# Patient Record
Sex: Male | Born: 1965 | Race: Black or African American | Hispanic: No | Marital: Single | Smoking: Current every day smoker
Health system: Southern US, Community
[De-identification: ages and names within clinical notes are randomized; demographics above are authoritative.]

## PROBLEM LIST (undated history)

## (undated) DIAGNOSIS — S43006A Unspecified dislocation of unspecified shoulder joint, initial encounter: Secondary | ICD-10-CM

## (undated) HISTORY — PX: HERNIA REPAIR: SHX51

---

## 1998-11-07 ENCOUNTER — Emergency Department (HOSPITAL_COMMUNITY): Admission: EM | Admit: 1998-11-07 | Discharge: 1998-11-07 | Payer: Self-pay | Admitting: Emergency Medicine

## 1998-11-07 ENCOUNTER — Encounter: Payer: Self-pay | Admitting: Emergency Medicine

## 2000-06-11 ENCOUNTER — Emergency Department (HOSPITAL_COMMUNITY): Admission: EM | Admit: 2000-06-11 | Discharge: 2000-06-11 | Payer: Self-pay | Admitting: Emergency Medicine

## 2000-06-11 ENCOUNTER — Encounter: Payer: Self-pay | Admitting: Emergency Medicine

## 2000-12-12 ENCOUNTER — Emergency Department (HOSPITAL_COMMUNITY): Admission: EM | Admit: 2000-12-12 | Discharge: 2000-12-12 | Payer: Self-pay | Admitting: Internal Medicine

## 2004-06-11 ENCOUNTER — Emergency Department (HOSPITAL_COMMUNITY): Admission: EM | Admit: 2004-06-11 | Discharge: 2004-06-11 | Payer: Self-pay | Admitting: Emergency Medicine

## 2012-08-14 ENCOUNTER — Emergency Department (HOSPITAL_COMMUNITY): Payer: Self-pay

## 2012-08-14 ENCOUNTER — Encounter (HOSPITAL_COMMUNITY): Payer: Self-pay | Admitting: *Deleted

## 2012-08-14 ENCOUNTER — Emergency Department (HOSPITAL_COMMUNITY)
Admission: EM | Admit: 2012-08-14 | Discharge: 2012-08-14 | Disposition: A | Discharge status: Home / Self Care | Payer: Self-pay | Attending: Emergency Medicine | Admitting: Emergency Medicine

## 2012-08-14 DIAGNOSIS — K420 Umbilical hernia with obstruction, without gangrene: Secondary | ICD-10-CM | POA: Insufficient documentation

## 2012-08-14 DIAGNOSIS — F17200 Nicotine dependence, unspecified, uncomplicated: Secondary | ICD-10-CM | POA: Insufficient documentation

## 2012-08-14 LAB — CBC WITH DIFFERENTIAL/PLATELET
Basophils Absolute: 0 10*3/uL (ref 0.0–0.1)
Basophils Relative: 1 % (ref 0–1)
Eosinophils Absolute: 0.2 10*3/uL (ref 0.0–0.7)
Eosinophils Relative: 3 % (ref 0–5)
HCT: 45 % (ref 39.0–52.0)
Hemoglobin: 15.6 g/dL (ref 13.0–17.0)
Lymphocytes Relative: 23 % (ref 12–46)
Lymphs Abs: 1.7 10*3/uL (ref 0.7–4.0)
MCH: 31.6 pg (ref 26.0–34.0)
MCHC: 34.7 g/dL (ref 30.0–36.0)
MCV: 91.3 fL (ref 78.0–100.0)
Monocytes Absolute: 0.6 10*3/uL (ref 0.1–1.0)
Monocytes Relative: 8 % (ref 3–12)
Neutro Abs: 4.7 10*3/uL (ref 1.7–7.7)
Neutrophils Relative %: 65 % (ref 43–77)
Platelets: 221 10*3/uL (ref 150–400)
RBC: 4.93 MIL/uL (ref 4.22–5.81)
RDW: 13.7 % (ref 11.5–15.5)
WBC: 7.2 10*3/uL (ref 4.0–10.5)

## 2012-08-14 LAB — BASIC METABOLIC PANEL
BUN: 18 mg/dL (ref 6–23)
CO2: 27 mEq/L (ref 19–32)
GFR calc Af Amer: >90 mL/min (ref >90)
GFR calc non Af Amer: >90 mL/min (ref >90)

## 2012-08-14 MED ORDER — IOHEXOL 300 MG/ML  SOLN
100.0000 mL | Freq: Once | INTRAMUSCULAR | Status: AC | PRN
  Administered 2012-08-14: 100 mL via INTRAVENOUS

## 2012-08-14 MED ORDER — PROMETHAZINE HCL 25 MG PO TABS: 25.0000 mg | ORAL_TABLET | Freq: Four times a day (QID) | ORAL | Status: DC | PRN

## 2012-08-14 MED ORDER — OXYCODONE-ACETAMINOPHEN 5-325 MG PO TABS
2.0000 | ORAL_TABLET | Freq: Once | ORAL | Status: AC
  Administered 2012-08-14: 2 via ORAL
  Filled 2012-08-14: qty 2

## 2012-08-14 MED ORDER — OXYCODONE-ACETAMINOPHEN 5-325 MG PO TABS: 1.0000 | ORAL_TABLET | ORAL | Status: DC | PRN

## 2012-08-14 NOTE — ED Provider Notes (Signed)
Medical screening examination/treatment/procedure(s) were conducted as a shared visit with non-physician practitioner(s) and myself.  I personally evaluated the patient during the encounter  Pt with umbilical hernia.  Blue discoloration.  Unable to reduce.  Will ct.  Doubt that there is bowel incarcerated, no vomiting, nl bm but will evaluate further with the CT.  Celene Kras, MD 08/14/12 2150

## 2012-08-14 NOTE — ED Provider Notes (Signed)
History     CSN: [id number]  Arrival date & time 08/14/12  1505   First MD Initiated Contact with Patient 08/14/12 1750      Chief Complaint  Patient presents with  . Abdominal Pain    (Consider location/radiation/quality/duration/timing/severity/associated sxs/prior treatment) HPI Comments: 46 year old male presents emergency department with sudden onset abdominal pain around his umbilicus after lifting things into the trash on Sunday night. He noticed a lump coming out of his belly button at that time. The lump is painful, worse with movements such as laying on his side causing a sharp pain rated 8/10. He has not tried any alleviating factors. No history of a hernia. He has had normal bowel movements daily since the pain began. Denies any nausea, vomiting.  Patient is a 46 y.o. male presenting with abdominal pain. The history is provided by the patient.  Abdominal Pain The primary symptoms of the illness include abdominal pain. The primary symptoms of the illness do not include shortness of breath, nausea or vomiting.  Symptoms associated with the illness do not include constipation or back pain.    History reviewed. No pertinent past medical history.  History reviewed. No pertinent past surgical history.  No family history on file.  History  Substance Use Topics  . Smoking status: Current Every Day Smoker  . Smokeless tobacco: Not on file  . Alcohol Use: No      Review of Systems  Constitutional: Negative for activity change.  Respiratory: Negative for shortness of breath.   Cardiovascular: Negative for chest pain.  Gastrointestinal: Positive for abdominal pain. Negative for nausea, vomiting and constipation.  Genitourinary: Negative for difficulty urinating.  Musculoskeletal: Negative for back pain.  Skin: Negative for color change.  Neurological: Negative for weakness.    Allergies  Review of patient's allergies indicates no known allergies.  Home Medications     Current Outpatient Rx  Name Route Sig Dispense Refill  . IBUPROFEN 200 MG PO TABS Oral Take 800 mg by mouth every 6 (six) hours as needed.      BP 146/82  Pulse 66  Temp 97.9 F (36.6 C) (Oral)  Resp 14  SpO2 96%  Physical Exam  Nursing note and vitals reviewed. Constitutional: He is oriented to person, place, and time. He appears well-developed and well-nourished. No distress.  HENT:  Head: Normocephalic and atraumatic.  Eyes: Conjunctivae normal are normal.  Neck: Normal range of motion. Neck supple.  Cardiovascular: Normal rate, regular rhythm and normal heart sounds.   Pulmonary/Chest: Effort normal and breath sounds normal.  Abdominal: Soft. Bowel sounds are normal. There is tenderness in the periumbilical area.       1 in diameter non-reducible hernia present in umbilicus. Bowel sounds present. Very tender to palpation.  Musculoskeletal: Normal range of motion.  Neurological: He is alert and oriented to person, place, and time.  Skin: Skin is warm and dry.  Psychiatric: He has a normal mood and affect. His behavior is normal.    ED Course  Procedures (including critical care time)  Labs Reviewed - No data to display No results found.   No diagnosis found.    MDM  46 y/o male with non-reducible umbilical hernia. Will obtain CT scan to r/o strangulation.  8:06 PM Case discussed with Schinlever, PA-C who will take over care of patient at this time.      Trevor Mace, PA-C 08/14/12 2006

## 2012-08-14 NOTE — ED Notes (Signed)
Pt reports lifting things into a trash can Sunday night and had pain at umbilicus. Has what appears to be a hernia protruding from umbilicus. Pain 8/10 with movement and certain positions. Denies urinary symptoms. Denies Hx hernia.

## 2012-08-14 NOTE — ED Provider Notes (Signed)
Pt received from [place], [place].  Pt had a CT of abdomen to further evaluate painful umbilical hernia.  Hernia contains fat and appears to be incarcerated.  Results discussed w/ pt.  On re-examination, umbilical hernia is firm and tender and can not be reduced; abd otherwise soft and non-tender.  Consulted Dr. Maisie Fus w/ GS, she has reviewed the CT and will see him in the office on Friday.  Pt d/c'd home w/ percocet and phenergan and Return precautions discussed.   Arie Sabina [place], [place] 08/14/12 2145

## 2012-08-14 NOTE — ED Provider Notes (Signed)
Error in the age in the MDM.  Pt is 46 not 19.  Gary Kras, MD 08/14/12 2011

## 2012-08-16 ENCOUNTER — Encounter (INDEPENDENT_AMBULATORY_CARE_PROVIDER_SITE_OTHER): Payer: Self-pay | Admitting: General Surgery

## 2012-08-16 ENCOUNTER — Ambulatory Visit (INDEPENDENT_AMBULATORY_CARE_PROVIDER_SITE_OTHER): Payer: Self-pay | Admitting: General Surgery

## 2012-08-16 VITALS — BP 158/110 | HR 72 | Temp 99.1°F | Resp 18 | Ht 75.0 in | Wt 243.0 lb

## 2012-08-16 DIAGNOSIS — K429 Umbilical hernia without obstruction or gangrene: Secondary | ICD-10-CM

## 2012-08-16 NOTE — Progress Notes (Signed)
Chief Complaint  Patient presents with  . Umbilical Hernia    HISTORY:  Gary Kaiser is a 46 y.o. male who presents to clinic with an umbilical hernia.  He noticed a "pop" while at work on Sun and has had pain at his umbilicus since then.  He went to the ED on Wed, and a CT was performed.  This showed a fat containing hernia at his umbilicus.  His pain is controlled right now.    No past medical history     No past surgical history    Current Outpatient Prescriptions  Medication Sig Dispense Refill  . ibuprofen (ADVIL,MOTRIN) 200 MG tablet Take 800 mg by mouth every 6 (six) hours as needed.      Marland Kitchen oxyCODONE-acetaminophen (PERCOCET/ROXICET) 5-325 MG per tablet Take 1 tablet by mouth every 4 (four) hours as needed for pain.  20 tablet  0  . promethazine (PHENERGAN) 25 MG tablet Take 1 tablet (25 mg total) by mouth every 6 (six) hours as needed for nausea.  20 tablet  0     No Known Allergies    No family history   Noncontributory  History   Social History  . Marital Status: Single    Spouse Name: N/A    Number of Children: N/A  . Years of Education: N/A   Social History Main Topics  . Smoking status: Current Every Day Smoker -- 0.5 packs/day  . Smokeless tobacco: None  . Alcohol Use: Yes  . Drug Use: Yes    Special: Marijuana     every day  . Sexually Active: None   Other Topics Concern  . None   Social History Narrative  . None     REVIEW OF SYSTEMS - PERTINENT POSITIVES ONLY: Review of Systems - General ROS: negative for - chills, fatigue or fever Hematological and Lymphatic ROS: negative for - bleeding problems, blood clots or bruising Respiratory ROS: no cough, shortness of breath, or wheezing Cardiovascular ROS: no chest pain or dyspnea on exertion Gastrointestinal ROS: positive for - abdominal pain negative for - change in stools or nausea/vomiting  EXAM: Filed Vitals:   08/16/12 1417  BP: 158/110  Pulse:   Temp:   Resp:     General appearance:  alert, cooperative and no distress Resp: clear to auscultation bilaterally Cardio: regular rate and rhythm GI: normal findings: bowel sounds normal and soft, non-tender and abnormal findings:  umbilical hernia and tender to palpation   LABORATORY RESULTS: Available labs are reviewed  Lab Results  Component Value Date   WBC 7.2 08/14/2012   HGB 15.6 08/14/2012   HCT 45.0 08/14/2012   MCV 91.3 08/14/2012   PLT 221 08/14/2012     Chemistry      Component Value Date/Time   NA 138 08/14/2012 1828   K 3.8 08/14/2012 1828   CL 100 08/14/2012 1828   CO2 27 08/14/2012 1828   BUN 18 08/14/2012 1828   CREATININE 0.71 08/14/2012 1828      Component Value Date/Time   CALCIUM 9.5 08/14/2012 1828       RADIOLOGY RESULTS:   Images and reports are reviewed.  CT Scan Abd/Pelvis 08/14/12  IMPRESSION:  1. Fat containing umbilical hernia is present which may be incarcerated.  2. Mild increased caliber of the proximal small bowel loops which may indicate ileus. No evidence for high-grade bowel obstruction.   ASSESSMENT AND PLAN: Hypertension- pt will make an apt to see PCP urgently Umbilical Hernia- we will schedule  for laparoscopic repair as soon as possible.  Continue narcotics as needed for pain.     Vanita Panda, MD Colon and Rectal Surgery / General Surgery [facility]      Visit Diagnoses: 1. Umbilical hernia     Primary Care Physician: No primary provider on file.

## 2012-08-16 NOTE — Patient Instructions (Signed)
Please make an appointment with a primary care doctor ASAP to discuss your high blood pressure.  We have provided you with information on hernia repair.  We will schedule this as soon as possible.

## 2012-08-19 ENCOUNTER — Encounter (HOSPITAL_COMMUNITY): Payer: Self-pay | Admitting: *Deleted

## 2012-08-20 ENCOUNTER — Other Ambulatory Visit (INDEPENDENT_AMBULATORY_CARE_PROVIDER_SITE_OTHER): Payer: Self-pay | Admitting: General Surgery

## 2012-08-23 ENCOUNTER — Ambulatory Visit (HOSPITAL_COMMUNITY)
Admission: RE | Admit: 2012-08-23 | Discharge: 2012-08-23 | Disposition: A | Discharge status: Home / Self Care | Payer: Self-pay | Source: Ambulatory Visit | Attending: General Surgery | Admitting: General Surgery

## 2012-08-23 ENCOUNTER — Encounter (HOSPITAL_COMMUNITY)
Admission: RE | Disposition: A | Discharge status: Home / Self Care | Payer: Self-pay | Source: Ambulatory Visit | Attending: General Surgery

## 2012-08-23 ENCOUNTER — Ambulatory Visit (HOSPITAL_COMMUNITY): Payer: Self-pay | Admitting: Anesthesiology

## 2012-08-23 ENCOUNTER — Encounter (HOSPITAL_COMMUNITY): Payer: Self-pay | Admitting: Anesthesiology

## 2012-08-23 ENCOUNTER — Encounter (HOSPITAL_COMMUNITY): Payer: Self-pay | Admitting: *Deleted

## 2012-08-23 DIAGNOSIS — I10 Essential (primary) hypertension: Secondary | ICD-10-CM | POA: Insufficient documentation

## 2012-08-23 DIAGNOSIS — K429 Umbilical hernia without obstruction or gangrene: Secondary | ICD-10-CM

## 2012-08-23 DIAGNOSIS — F17200 Nicotine dependence, unspecified, uncomplicated: Secondary | ICD-10-CM | POA: Insufficient documentation

## 2012-08-23 HISTORY — PX: UMBILICAL HERNIA REPAIR: SHX196

## 2012-08-23 LAB — SURGICAL PCR SCREEN
MRSA, PCR: NEGATIVE
Staphylococcus aureus: POSITIVE — AB

## 2012-08-23 SURGERY — INSERTION OF MESH
Anesthesia: General | Site: Abdomen | Wound class: Clean

## 2012-08-23 MED ORDER — SODIUM CHLORIDE 0.9 % IJ SOLN: 3.0000 mL | INTRAMUSCULAR | Status: DC | PRN

## 2012-08-23 MED ORDER — ONDANSETRON HCL 4 MG/2ML IJ SOLN
INTRAMUSCULAR | Status: DC | PRN
  Administered 2012-08-23: 4 mg via INTRAVENOUS

## 2012-08-23 MED ORDER — MUPIROCIN 2 % EX OINT: TOPICAL_OINTMENT | Freq: Two times a day (BID) | CUTANEOUS | Status: DC

## 2012-08-23 MED ORDER — SODIUM CHLORIDE 0.9 % IV SOLN: 250.0000 mL | INTRAVENOUS | Status: DC | PRN

## 2012-08-23 MED ORDER — MORPHINE SULFATE 10 MG/ML IJ SOLN: 2.0000 mg | INTRAMUSCULAR | Status: DC | PRN

## 2012-08-23 MED ORDER — PROPOFOL 10 MG/ML IV BOLUS
INTRAVENOUS | Status: DC | PRN
  Administered 2012-08-23: 200 mg via INTRAVENOUS

## 2012-08-23 MED ORDER — LACTATED RINGERS IV SOLN
INTRAVENOUS | Status: DC | PRN
  Administered 2012-08-23 (×2): via INTRAVENOUS

## 2012-08-23 MED ORDER — CHLORHEXIDINE GLUCONATE 4 % EX LIQD: 1.0000 "application " | Freq: Once | CUTANEOUS | Status: DC

## 2012-08-23 MED ORDER — ACETAMINOPHEN 10 MG/ML IV SOLN
INTRAVENOUS | Status: DC | PRN
  Administered 2012-08-23: 1000 mg via INTRAVENOUS

## 2012-08-23 MED ORDER — ONDANSETRON HCL 4 MG/2ML IJ SOLN: 4.0000 mg | Freq: Four times a day (QID) | INTRAMUSCULAR | Status: DC | PRN

## 2012-08-23 MED ORDER — NEOSTIGMINE METHYLSULFATE 1 MG/ML IJ SOLN
INTRAMUSCULAR | Status: DC | PRN
  Administered 2012-08-23: 4 mg via INTRAVENOUS

## 2012-08-23 MED ORDER — CEFAZOLIN SODIUM-DEXTROSE 2-3 GM-% IV SOLR
2.0000 g | INTRAVENOUS | Status: AC
  Administered 2012-08-23: 2 g via INTRAVENOUS

## 2012-08-23 MED ORDER — MIDAZOLAM HCL 5 MG/5ML IJ SOLN
INTRAMUSCULAR | Status: DC | PRN
  Administered 2012-08-23: 2 mg via INTRAVENOUS

## 2012-08-23 MED ORDER — HYDROMORPHONE HCL PF 1 MG/ML IJ SOLN
INTRAMUSCULAR | Status: AC
  Filled 2012-08-23: qty 1

## 2012-08-23 MED ORDER — HYDROMORPHONE HCL PF 1 MG/ML IJ SOLN
0.2500 mg | INTRAMUSCULAR | Status: DC | PRN
  Administered 2012-08-23 (×3): 0.5 mg via INTRAVENOUS

## 2012-08-23 MED ORDER — GLYCOPYRROLATE 0.2 MG/ML IJ SOLN
INTRAMUSCULAR | Status: DC | PRN
  Administered 2012-08-23: 0.6 mg via INTRAVENOUS

## 2012-08-23 MED ORDER — SUFENTANIL CITRATE 50 MCG/ML IV SOLN
INTRAVENOUS | Status: DC | PRN
  Administered 2012-08-23 (×5): 10 ug via INTRAVENOUS

## 2012-08-23 MED ORDER — LIDOCAINE HCL (CARDIAC) 20 MG/ML IV SOLN
INTRAVENOUS | Status: DC | PRN
  Administered 2012-08-23: 75 mg via INTRAVENOUS

## 2012-08-23 MED ORDER — LACTATED RINGERS IV SOLN: INTRAVENOUS | Status: DC

## 2012-08-23 MED ORDER — ACETAMINOPHEN 325 MG PO TABS: 650.0000 mg | ORAL_TABLET | ORAL | Status: DC | PRN

## 2012-08-23 MED ORDER — HEPARIN SODIUM (PORCINE) 5000 UNIT/ML IJ SOLN
5000.0000 [IU] | Freq: Once | INTRAMUSCULAR | Status: AC
  Administered 2012-08-23: 5000 [IU] via SUBCUTANEOUS
  Filled 2012-08-23: qty 1

## 2012-08-23 MED ORDER — OXYCODONE HCL 5 MG PO TABS
5.0000 mg | ORAL_TABLET | ORAL | Status: DC | PRN
  Administered 2012-08-23: 5 mg via ORAL
  Filled 2012-08-23: qty 1

## 2012-08-23 MED ORDER — MUPIROCIN 2 % EX OINT
TOPICAL_OINTMENT | CUTANEOUS | Status: AC
  Administered 2012-08-23: 1 via NASAL
  Filled 2012-08-23: qty 22

## 2012-08-23 MED ORDER — SODIUM CHLORIDE 0.9 % IJ SOLN: 3.0000 mL | Freq: Two times a day (BID) | INTRAMUSCULAR | Status: DC

## 2012-08-23 MED ORDER — OXYCODONE-ACETAMINOPHEN 5-325 MG PO TABS: 1.0000 | ORAL_TABLET | ORAL | Status: DC | PRN

## 2012-08-23 MED ORDER — BUPIVACAINE-EPINEPHRINE 0.25% -1:200000 IJ SOLN
INTRAMUSCULAR | Status: DC | PRN
  Administered 2012-08-23: 20 mL

## 2012-08-23 MED ORDER — HYDROMORPHONE HCL PF 1 MG/ML IJ SOLN
INTRAMUSCULAR | Status: AC
  Administered 2012-08-23: 0.5 mg
  Filled 2012-08-23: qty 1

## 2012-08-23 MED ORDER — BUPIVACAINE-EPINEPHRINE PF 0.25-1:200000 % IJ SOLN
INTRAMUSCULAR | Status: AC
  Filled 2012-08-23: qty 30

## 2012-08-23 MED ORDER — ACETAMINOPHEN 650 MG RE SUPP
650.0000 mg | RECTAL | Status: DC | PRN
  Filled 2012-08-23: qty 1

## 2012-08-23 MED ORDER — CEFAZOLIN SODIUM-DEXTROSE 2-3 GM-% IV SOLR
INTRAVENOUS | Status: AC
  Filled 2012-08-23: qty 50

## 2012-08-23 MED ORDER — ROCURONIUM BROMIDE 100 MG/10ML IV SOLN
INTRAVENOUS | Status: DC | PRN
  Administered 2012-08-23: 60 mg via INTRAVENOUS
  Administered 2012-08-23: 10 mg via INTRAVENOUS

## 2012-08-23 MED ORDER — ACETAMINOPHEN 10 MG/ML IV SOLN
INTRAVENOUS | Status: AC
  Filled 2012-08-23: qty 100

## 2012-08-23 SURGICAL SUPPLY — 51 items
APPLIER CLIP LOGIC TI 5 (MISCELLANEOUS) IMPLANT
BENZOIN TINCTURE PRP APPL 2/3 (GAUZE/BANDAGES/DRESSINGS) IMPLANT
CABLE HIGH FREQUENCY MONO STRZ (ELECTRODE) ×3 IMPLANT
CANISTER SUCTION 2500CC (MISCELLANEOUS) ×3 IMPLANT
CHLORAPREP W/TINT 26ML (MISCELLANEOUS) ×3 IMPLANT
CLOTH BEACON ORANGE TIMEOUT ST (SAFETY) ×3 IMPLANT
DECANTER SPIKE VIAL GLASS SM (MISCELLANEOUS) ×3 IMPLANT
DERMABOND ADVANCED (GAUZE/BANDAGES/DRESSINGS) ×1
DERMABOND ADVANCED .7 DNX12 (GAUZE/BANDAGES/DRESSINGS) ×2 IMPLANT
DEVICE SECURE STRAP 25 ABSORB (INSTRUMENTS) ×3 IMPLANT
DEVICE TROCAR PUNCTURE CLOSURE (ENDOMECHANICALS) ×3 IMPLANT
DRAPE INCISE IOBAN 66X45 STRL (DRAPES) ×3 IMPLANT
DRAPE LAPAROSCOPIC ABDOMINAL (DRAPES) ×3 IMPLANT
DRAPE UTILITY XL STRL (DRAPES) ×3 IMPLANT
DRSG TEGADERM 2-3/8X2-3/4 SM (GAUZE/BANDAGES/DRESSINGS) ×3 IMPLANT
ELECT REM PT RETURN 9FT ADLT (ELECTROSURGICAL) ×3
ELECTRODE REM PT RTRN 9FT ADLT (ELECTROSURGICAL) ×2 IMPLANT
GLOVE BIO SURGEON STRL SZ7.5 (GLOVE) ×3 IMPLANT
GLOVE BIOGEL M STRL SZ7.5 (GLOVE) IMPLANT
GLOVE BIOGEL PI IND STRL 7.0 (GLOVE) ×2 IMPLANT
GLOVE BIOGEL PI INDICATOR 7.0 (GLOVE) ×1
GLOVE INDICATOR 8.0 STRL GRN (GLOVE) ×3 IMPLANT
GOWN STRL NON-REIN LRG LVL3 (GOWN DISPOSABLE) ×3 IMPLANT
GOWN STRL REIN XL XLG (GOWN DISPOSABLE) ×6 IMPLANT
KIT BASIN OR (CUSTOM PROCEDURE TRAY) ×3 IMPLANT
MESH PARIETEX 4.7 (Mesh General) ×3 IMPLANT
NEEDLE HYPO 22GX1.5 SAFETY (NEEDLE) ×6 IMPLANT
NEEDLE INSUFFLATION 14GA 120MM (NEEDLE) IMPLANT
NEEDLE SPNL 22GX3.5 QUINCKE BK (NEEDLE) IMPLANT
NS IRRIG 1000ML POUR BTL (IV SOLUTION) ×3 IMPLANT
PEN SKIN MARKING BROAD (MISCELLANEOUS) ×3 IMPLANT
SCALPEL HARMONIC ACE (MISCELLANEOUS) IMPLANT
SCISSORS LAP 5X35 DISP (ENDOMECHANICALS) IMPLANT
SOLUTION ANTI FOG 6CC (MISCELLANEOUS) ×3 IMPLANT
STAPLER VISISTAT 35W (STAPLE) IMPLANT
STRIP CLOSURE SKIN 1/2X4 (GAUZE/BANDAGES/DRESSINGS) IMPLANT
SUT MNCRL AB 4-0 PS2 18 (SUTURE) ×6 IMPLANT
SUT NOVA NAB GS-21 0 18 T12 DT (SUTURE) IMPLANT
SUT PROLENE 0 CT 2 (SUTURE) ×6 IMPLANT
SUT PROLENE 1 CT 1 30 (SUTURE) IMPLANT
SUT PROLENE 2 0 CT 1 CR (SUTURE) ×3 IMPLANT
SUT PROLENE 2 0 SH DA (SUTURE) IMPLANT
TACKER 5MM HERNIA 3.5CML NAB (ENDOMECHANICALS) IMPLANT
TOWEL OR 17X26 10 PK STRL BLUE (TOWEL DISPOSABLE) ×6 IMPLANT
TRAY FOLEY CATH 14FRSI W/METER (CATHETERS) IMPLANT
TRAY LAP CHOLE (CUSTOM PROCEDURE TRAY) ×3 IMPLANT
TROCAR BLADELESS OPT 5 100 (ENDOMECHANICALS) ×3 IMPLANT
TROCAR BLADELESS OPT 5 75 (ENDOMECHANICALS) ×3 IMPLANT
TROCAR XCEL BLUNT TIP 100MML (ENDOMECHANICALS) IMPLANT
TROCAR XCEL NON-BLD 11X100MML (ENDOMECHANICALS) ×3 IMPLANT
TUBING INSUFFLATION 10FT LAP (TUBING) ×3 IMPLANT

## 2012-08-23 NOTE — Anesthesia Postprocedure Evaluation (Signed)
  Anesthesia Post-op Note  Patient: Gary Kaiser  Procedure(s) Performed: Procedure(s) (LRB): INSERTION OF MESH (N/A) LAPAROSCOPIC UMBILICAL HERNIA (N/A)  Patient Location: PACU  Anesthesia Type: General  Level of Consciousness: awake and alert   Airway and Oxygen Therapy: Patient Spontanous Breathing  Post-op Pain: mild  Post-op Assessment: Post-op Vital signs reviewed, Patient's Cardiovascular Status Stable, Respiratory Function Stable, Patent Airway and No signs of Nausea or vomiting  Post-op Vital Signs: stable  Complications: No apparent anesthesia complications

## 2012-08-23 NOTE — H&P (View-Only) (Signed)
Chief Complaint  Patient presents with  . Umbilical Hernia    HISTORY:  Gary Kaiser is a 46 y.o. male who presents to clinic with an umbilical hernia.  He noticed a "pop" while at work on Sun and has had pain at his umbilicus since then.  He went to the ED on Wed, and a CT was performed.  This showed a fat containing hernia at his umbilicus.  His pain is controlled right now.    No past medical history     No past surgical history    Current Outpatient Prescriptions  Medication Sig Dispense Refill  . ibuprofen (ADVIL,MOTRIN) 200 MG tablet Take 800 mg by mouth every 6 (six) hours as needed.      . oxyCODONE-acetaminophen (PERCOCET/ROXICET) 5-325 MG per tablet Take 1 tablet by mouth every 4 (four) hours as needed for pain.  20 tablet  0  . promethazine (PHENERGAN) 25 MG tablet Take 1 tablet (25 mg total) by mouth every 6 (six) hours as needed for nausea.  20 tablet  0     No Known Allergies    No family history   Noncontributory  History   Social History  . Marital Status: Single    Spouse Name: N/A    Number of Children: N/A  . Years of Education: N/A   Social History Main Topics  . Smoking status: Current Every Day Smoker -- 0.5 packs/day  . Smokeless tobacco: None  . Alcohol Use: Yes  . Drug Use: Yes    Special: Marijuana     every day  . Sexually Active: None   Other Topics Concern  . None   Social History Narrative  . None     REVIEW OF SYSTEMS - PERTINENT POSITIVES ONLY: Review of Systems - General ROS: negative for - chills, fatigue or fever Hematological and Lymphatic ROS: negative for - bleeding problems, blood clots or bruising Respiratory ROS: no cough, shortness of breath, or wheezing Cardiovascular ROS: no chest pain or dyspnea on exertion Gastrointestinal ROS: positive for - abdominal pain negative for - change in stools or nausea/vomiting  EXAM: Filed Vitals:   08/16/12 1417  BP: 158/110  Pulse:   Temp:   Resp:     General appearance:  alert, cooperative and no distress Resp: clear to auscultation bilaterally Cardio: regular rate and rhythm GI: normal findings: bowel sounds normal and soft, non-tender and abnormal findings:  umbilical hernia and tender to palpation   LABORATORY RESULTS: Available labs are reviewed  Lab Results  Component Value Date   WBC 7.2 08/14/2012   HGB 15.6 08/14/2012   HCT 45.0 08/14/2012   MCV 91.3 08/14/2012   PLT 221 08/14/2012     Chemistry      Component Value Date/Time   NA 138 08/14/2012 1828   K 3.8 08/14/2012 1828   CL 100 08/14/2012 1828   CO2 27 08/14/2012 1828   BUN 18 08/14/2012 1828   CREATININE 0.71 08/14/2012 1828      Component Value Date/Time   CALCIUM 9.5 08/14/2012 1828       RADIOLOGY RESULTS:   Images and reports are reviewed.  CT Scan Abd/Pelvis 08/14/12  IMPRESSION:  1. Fat containing umbilical hernia is present which may be incarcerated.  2. Mild increased caliber of the proximal small bowel loops which may indicate ileus. No evidence for high-grade bowel obstruction.   ASSESSMENT AND PLAN: Hypertension- pt will make an apt to see PCP urgently Umbilical Hernia- we will schedule   for laparoscopic repair as soon as possible.  Continue narcotics as needed for pain.     Phillis Thackeray C Nilesh Stegall, MD Colon and Rectal Surgery / General Surgery [facility]      Visit Diagnoses: 1. Umbilical hernia     Primary Care Physician: No primary provider on file.     

## 2012-08-23 NOTE — Interval H&P Note (Signed)
History and Physical Interval Note:  08/23/2012 7:02 AM  Thurmond Butts  has presented today for surgery, with the diagnosis of ventral hernia  The various methods of treatment have been discussed with the patient and family. After consideration of risks, benefits and other options for treatment, the patient has consented to  Procedure(s) (LRB) with comments: LAPAROSCOPIC VENTRAL HERNIA (N/A) - Laparoscopic Ventral Hernia Repair with Mesh INSERTION OF MESH (N/A) as a surgical intervention .  The patient's history has been reviewed, patient examined, no change in status, stable for surgery.  I have reviewed the patient's chart and labs.  Questions were answered to the patient's satisfaction.  We discussed the risks of the operation including bleeding, infection, postoperative pain, recurrence and need for additional surgery.  I believe he understands these risks and has agreed to proceed.   Vanita Panda, MD  Colorectal and General Surgery [facility]

## 2012-08-23 NOTE — Anesthesia Preprocedure Evaluation (Addendum)
Anesthesia Evaluation  Patient identified by MRN, date of birth, ID band Patient awake    Reviewed: Allergy & Precautions, H&P , NPO status , Patient's Chart, lab work & pertinent test results  Airway Mallampati: II TM Distance: >3 FB Neck ROM: full    Dental No notable dental hx. (+) Teeth Intact and Dental Advisory Given   Pulmonary neg pulmonary ROS, Current Smoker,  breath sounds clear to auscultation  Pulmonary exam normal       Cardiovascular Exercise Tolerance: Good negative cardio ROS  Rhythm:regular Rate:Normal     Neuro/Psych negative neurological ROS  negative psych ROS   GI/Hepatic negative GI ROS, Neg liver ROS,   Endo/Other  negative endocrine ROS  Renal/GU negative Renal ROS  negative genitourinary   Musculoskeletal   Abdominal   Peds  Hematology negative hematology ROS (+)   Anesthesia Other Findings   Reproductive/Obstetrics negative OB ROS                         Anesthesia Physical Anesthesia Plan  ASA: II  Anesthesia Plan: General   Post-op Pain Management:    Induction: Intravenous  Airway Management Planned: Oral ETT  Additional Equipment:   Intra-op Plan:   Post-operative Plan: Extubation in OR  Informed Consent: I have reviewed the patients History and Physical, chart, labs and discussed the procedure including the risks, benefits and alternatives for the proposed anesthesia with the patient or authorized representative who has indicated his/her understanding and acceptance.   Dental Advisory Given  Plan Discussed with: CRNA and Surgeon  Anesthesia Plan Comments:       Anesthesia Quick Evaluation

## 2012-08-23 NOTE — Op Note (Signed)
08/23/2012  8:43 AM  PATIENT:  Gary Kaiser  46 y.o. male  PRE-OPERATIVE DIAGNOSIS:  umbilical hernia  POST-OPERATIVE DIAGNOSIS:  umbilical hernia  PROCEDURE:  Procedure(s) (LRB) with comments: INSERTION OF MESH (N/A) LAPAROSCOPIC UMBILICAL HERNIA (N/A)  SURGEON:  Surgeon(s) and Role:    * Romie Levee, MD - Primary    * Robyne Askew, MD - Assisting   ANESTHESIA:   general  EBL:  Total I/O In: 1000 [I.V.:1000] Out: -   BLOOD ADMINISTERED:none  DRAINS: none   LOCAL MEDICATIONS USED:  MARCAINE     SPECIMEN:  No Specimen  COUNTS:  YES  PLAN OF CARE: Discharge to home after PACU  PATIENT DISPOSITION:  PACU - hemodynamically stable.   Delay start of Pharmacological VTE agent (>24hrs) due to surgical blood loss or risk of bleeding: not applicable  INDICATIONS:  Gary Kaiser is a 46 y.o. male who presented to clinic last week with an umbilical hernia. He noticed a "pop" while at work last Sun and has had pain at his umbilicus since then. He went to the ED last Wed, and a CT was performed. This showed a fat containing hernia at his umbilicus.  He is here for repair.  Findings: umbilical hernia, acute (no hernia sac noted)  Procedure: The patient was identified in the preoperative holding area and taken to the operating room where he was laid supine on the operating room table.  General anesthesia was smoothly induced. Patient's abdomen was prepped and draped in the usual sterile fashion.  An ioban barrier was used under the drape. A surgical timeout was performed indicating the correct patient procedure positioning and preoperative antibiotics. SCDs were also noted to be in place prior to the initiation of anesthesia. An incision was made in the left upper quadrant. A optical intrigue 5mm trocar and 0 scope were used to enter the abdomen bluntly. Once inside the abdomen the abdomen was insufflated to approximately 15 mm of mercury. The abdomen was inspected. There were no  injuries from our optical intrigue site. A second 11 mm port was placed in the left lower quadrant under direct visualization. A 5 mm port was placed in the right lower quadrant also under direct visualization. After this was completed the abdomen was inspected. There was an umbilical hernia with an incarcerated portion of omentum. This was removed from the hernia. There was no signs of necrotic tissue. There was a small amount of fluid in the umbilical hernia but the fluid was clear in nature. There are no signs of infection. Fat was cleared around the hernia site using a Bovie electrocautery. Once this was completed the abdomen was desufflated and a 12 mm round prior to ask composite mesh was brought onto the field. The mesh was approximated on top of the abdomen and the abdomen was marked for mesh placement. Four 0 Prolene sutures were then placed through the mesh and sutured into place.  The mesh was then irrigated and rolled up and then placed into the abdomen through the 11 mm port. The mesh was then oriented and a suture passer was used to first closed the hernia site using a 0 Prolene suture. Once this was completed the suture passer was used to bring up the Prolene sutures through the abdominal fascia. These were then tied into place and the mesh was secured.  A tacking device was then used to secure to the abdominal wall. After this was completed the mesh appeared to lie nicely  on the fascia. The remaining abdomen was inspected for hemostasis. There was no sign of active bleeding. The 11 mm port was removed and the fascia was closed using a 0 Prolene suture. This was done laparoscopically. The remaining 5 mm port was removed under direct visualization and there was no active bleeding. The abdomen was then desufflated. The skin of the 3 trocar sites was closed using 4-0 Monocryl suture. Dermabond was also used to close the skin. The patient was then awakened from anesthesia and sent to the postanesthesia  care unit in stable condition. All counts were correct per operating room staff.  I was present and personally performed the entire procedure.

## 2012-08-23 NOTE — Transfer of Care (Signed)
Immediate Anesthesia Transfer of Care Note  Patient: Gary Kaiser  Procedure(s) Performed: Procedure(s) (LRB) with comments: INSERTION OF MESH (N/A) LAPAROSCOPIC UMBILICAL HERNIA (N/A)  Patient Location: PACU  Anesthesia Type: General  Level of Consciousness: awake, sedated and patient cooperative  Airway & Oxygen Therapy: Patient Spontanous Breathing and Patient connected to face mask oxygen  Post-op Assessment: Report given to PACU RN and Post -op Vital signs reviewed and stable  Post vital signs: Reviewed and stable  Complications: No apparent anesthesia complications

## 2012-08-26 ENCOUNTER — Encounter (HOSPITAL_COMMUNITY): Payer: Self-pay | Admitting: General Surgery

## 2012-08-28 ENCOUNTER — Encounter (INDEPENDENT_AMBULATORY_CARE_PROVIDER_SITE_OTHER): Payer: Self-pay | Admitting: General Surgery

## 2012-08-28 ENCOUNTER — Telehealth (INDEPENDENT_AMBULATORY_CARE_PROVIDER_SITE_OTHER): Payer: Self-pay | Admitting: General Surgery

## 2012-08-28 DIAGNOSIS — G8918 Other acute postprocedural pain: Secondary | ICD-10-CM

## 2012-08-28 MED ORDER — OXYCODONE-ACETAMINOPHEN 5-325 MG PO TABS: 1.0000 | ORAL_TABLET | Freq: Four times a day (QID) | ORAL | Status: DC | PRN

## 2012-08-28 NOTE — Telephone Encounter (Signed)
Patient requesting a refill of oxycodone status post hernia repair on 08/23/12. Dr Maisie Fus paged. Okay per Dr

## 2012-08-28 NOTE — Telephone Encounter (Signed)
Okay per Dr Maisie Fus to prescribe oxycodone 5/325 #30 with no refills. To make patient aware to try to start weaning himself off of it. Signed per Dr Magnus Ivan. At the front for patient pick up. Patient's mother aware. Advised to try ibuprofen between doses to ween off.

## 2012-09-11 ENCOUNTER — Encounter (INDEPENDENT_AMBULATORY_CARE_PROVIDER_SITE_OTHER): Payer: Self-pay | Admitting: General Surgery

## 2012-09-11 ENCOUNTER — Encounter (INDEPENDENT_AMBULATORY_CARE_PROVIDER_SITE_OTHER): Payer: Self-pay

## 2012-09-11 ENCOUNTER — Ambulatory Visit (INDEPENDENT_AMBULATORY_CARE_PROVIDER_SITE_OTHER): Payer: Self-pay | Admitting: General Surgery

## 2012-09-11 VITALS — BP 140/88 | HR 72 | Temp 97.7°F | Resp 12 | Ht 75.0 in | Wt 240.4 lb

## 2012-09-11 DIAGNOSIS — K429 Umbilical hernia without obstruction or gangrene: Secondary | ICD-10-CM

## 2012-09-11 NOTE — Patient Instructions (Signed)
OK to return to work.  No lifting anything greater than 20lbs until 11/14

## 2012-09-11 NOTE — Progress Notes (Signed)
Gary Kaiser is a 46 y.o. male who is status post a lap umb hernia repair with mesh on 9/27.  He is doing well.  His pain is getting better.  He is ready to go back to work.  Objective: Filed Vitals:   09/11/12 1636  BP: 140/88  Pulse: 72  Temp: 97.7 F (36.5 C)  Resp: 12    General appearance: alert and cooperative firm umbilicus, nontender to palpation  Incision: healing well   Assessment: s/p  There is no problem list on file for this patient.     Plan:  Healing well.  Umbilicus with scar.  Ok to go back to work on [employer] duty   .Vanita Panda, MD [facility], [place] [phone]   09/11/2012 4:52 PM

## 2012-09-26 ENCOUNTER — Encounter (HOSPITAL_COMMUNITY): Payer: Self-pay | Admitting: *Deleted

## 2012-09-26 ENCOUNTER — Emergency Department (HOSPITAL_COMMUNITY)
Admission: EM | Admit: 2012-09-26 | Discharge: 2012-09-26 | Disposition: A | Discharge status: Home / Self Care | Payer: No Typology Code available for payment source | Attending: Emergency Medicine | Admitting: Emergency Medicine

## 2012-09-26 DIAGNOSIS — Y92410 Unspecified street and highway as the place of occurrence of the external cause: Secondary | ICD-10-CM | POA: Insufficient documentation

## 2012-09-26 DIAGNOSIS — Y9389 Activity, other specified: Secondary | ICD-10-CM | POA: Insufficient documentation

## 2012-09-26 DIAGNOSIS — M25519 Pain in unspecified shoulder: Secondary | ICD-10-CM | POA: Insufficient documentation

## 2012-09-26 DIAGNOSIS — F17200 Nicotine dependence, unspecified, uncomplicated: Secondary | ICD-10-CM | POA: Insufficient documentation

## 2012-09-26 DIAGNOSIS — M25569 Pain in unspecified knee: Secondary | ICD-10-CM | POA: Insufficient documentation

## 2012-09-26 MED ORDER — DIAZEPAM 5 MG PO TABS: 5.0000 mg | ORAL_TABLET | Freq: Two times a day (BID) | ORAL | Status: DC

## 2012-09-26 MED ORDER — OXYCODONE-ACETAMINOPHEN 5-325 MG PO TABS: 1.0000 | ORAL_TABLET | Freq: Four times a day (QID) | ORAL | Status: DC | PRN
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
## 2012-09-27 NOTE — ED Provider Notes (Signed)
Medical screening examination/treatment/procedure(s) were performed by non-physician practitioner and as supervising physician I was immediately available for consultation/collaboration.  Tyrice Hewitt L Tamelia Michalowski, MD 09/27/12 0953 

## 2013-11-08 IMAGING — CT CT ABD-PELV W/ CM
1 of 3 series · 14 of 32 positions shown, 19 images · IV contrast (OMNIPAQUE 300)
Comparison: None.

CLINICAL DATA: Abdominal pain

CT ABDOMEN AND PELVIS WITH CONTRAST
TECHNIQUE: Multidetector CT imaging of the abdomen and pelvis was
performed following the standard protocol during bolus
administration of intravenous contrast.
Contrast: 100mL OMNIPAQUE IOHEXOL 300 MG/ML  SOLN

[Series 2: abd/pel with · axial · 0.74mm/px · z∈[-486,-60]mm · 14 of 96 slices shown, 19 images]
[im 6/96  soft-tissue]
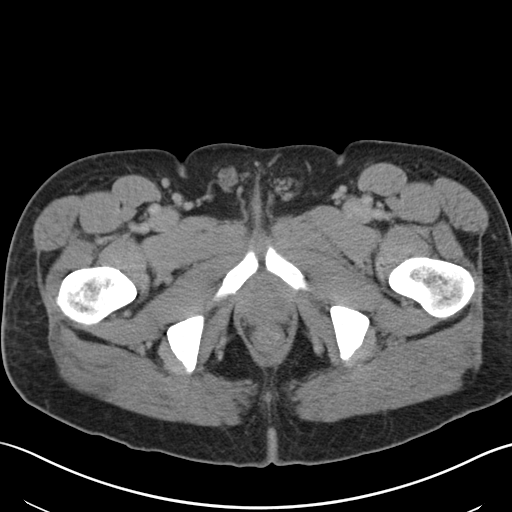
[im 6/96  bone]
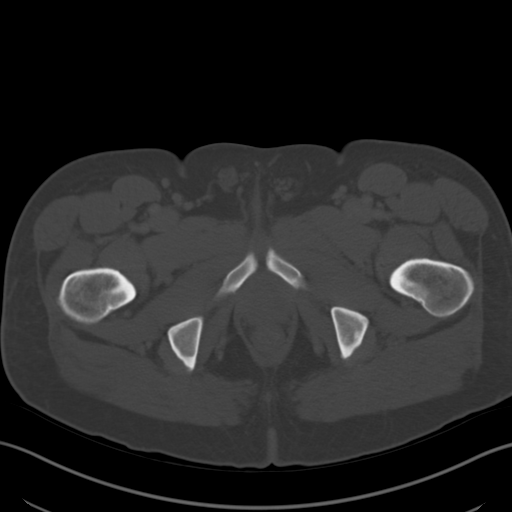
[im 16/96  soft-tissue]
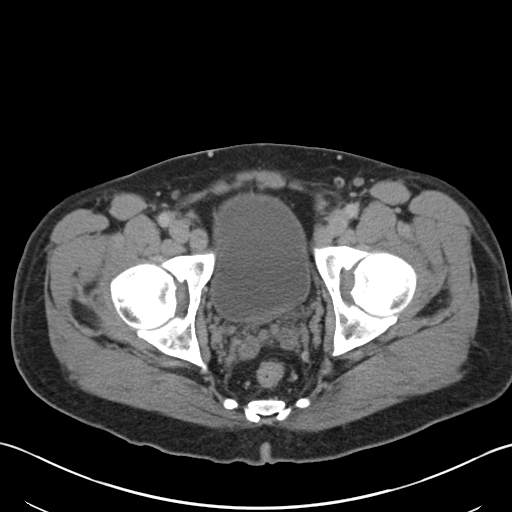
[im 21/96  soft-tissue]
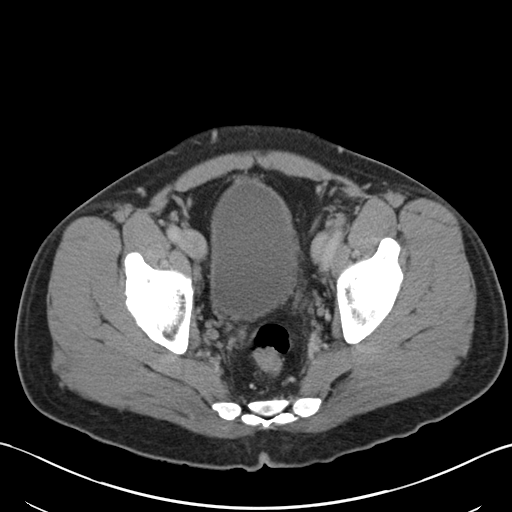
[im 26/96  soft-tissue]
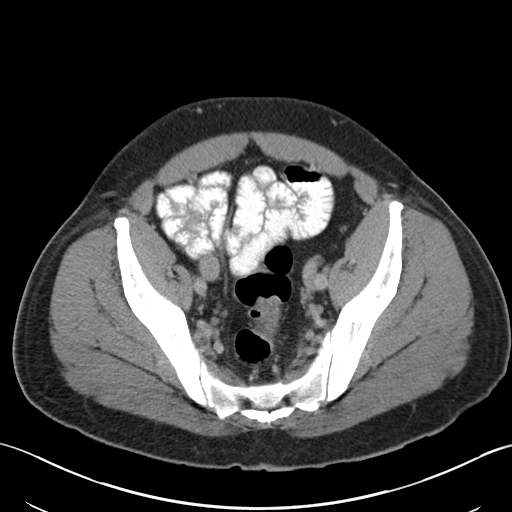
[im 36/96  soft-tissue]
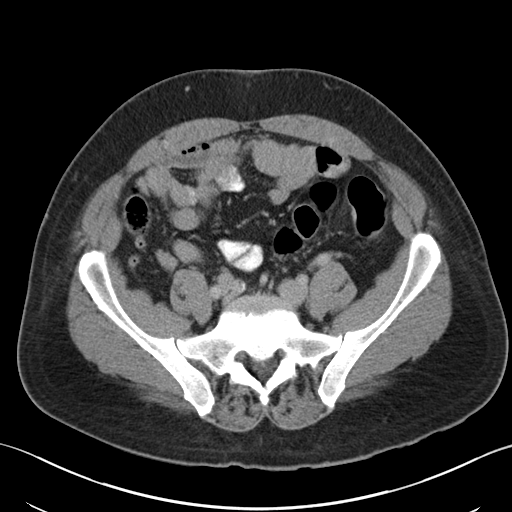
[im 41/96  soft-tissue]
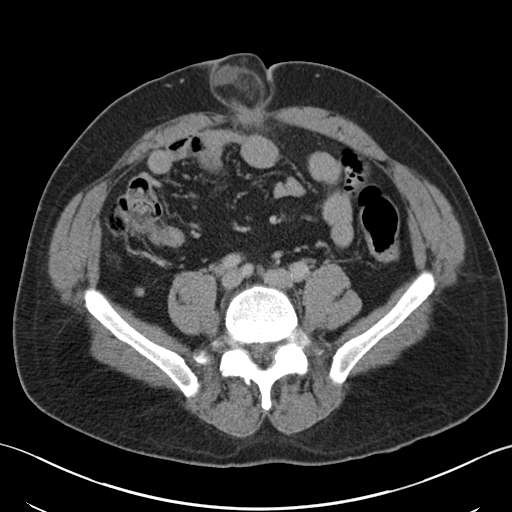
[im 51/96  soft-tissue]
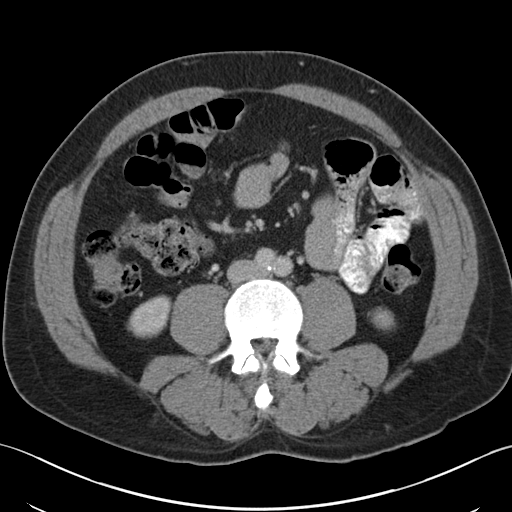
[im 56/96  soft-tissue]
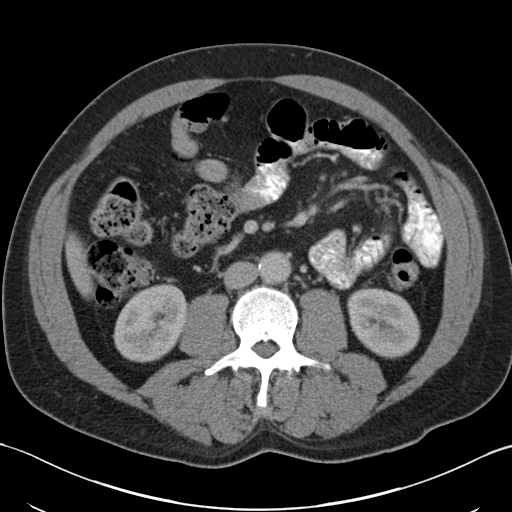
[im 61/96  soft-tissue]
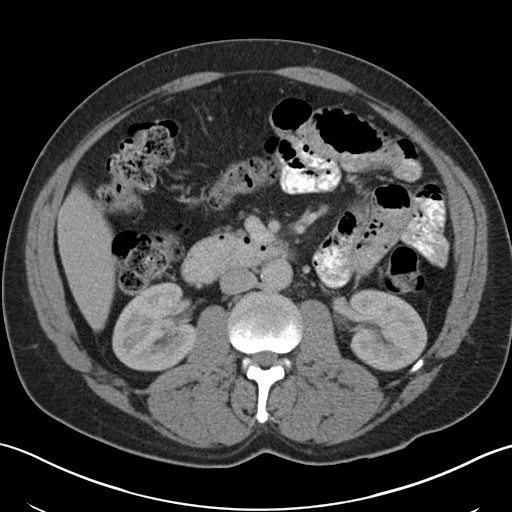
[im 61/96  bone]
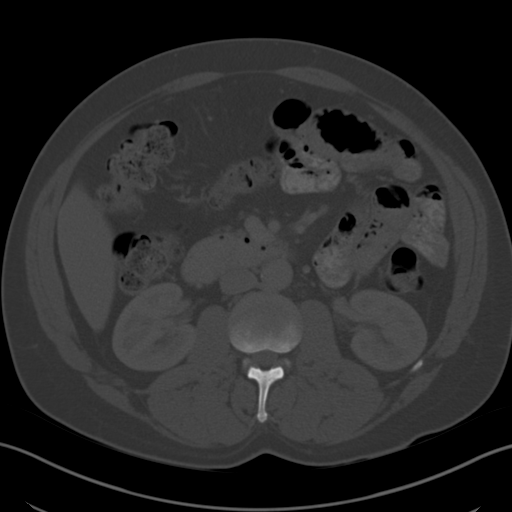
[im 71/96  soft-tissue]
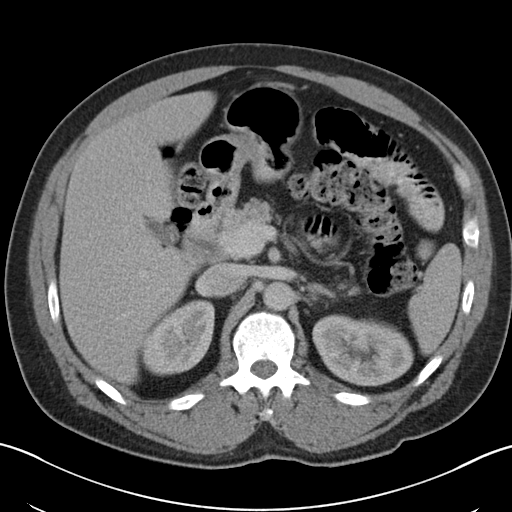
[im 76/96  soft-tissue]
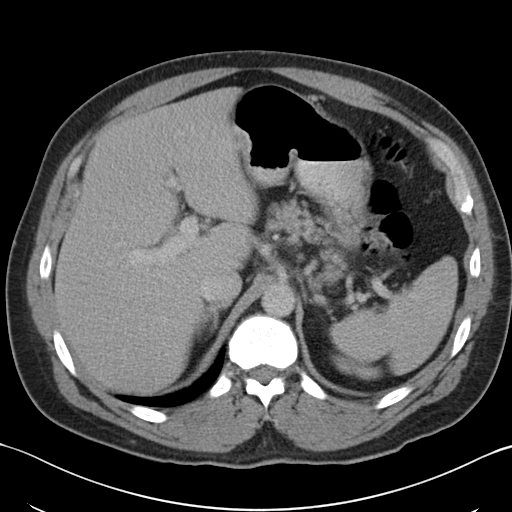
[im 76/96  lung]
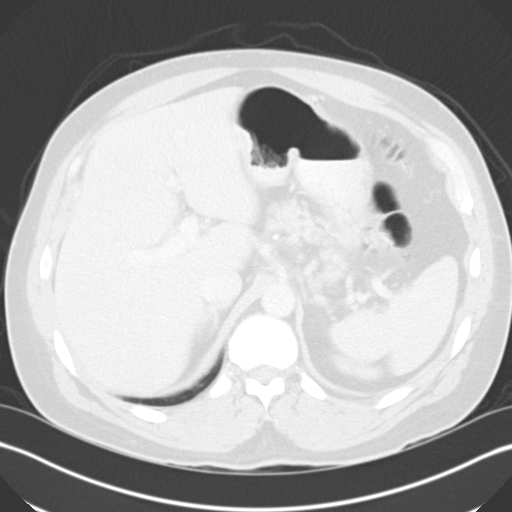
[im 81/96  soft-tissue]
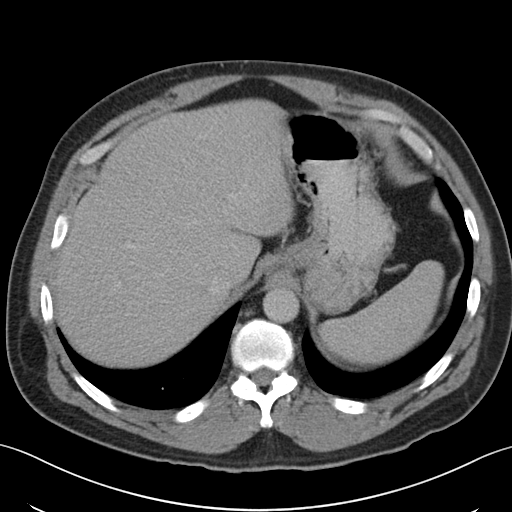
[im 81/96  lung]
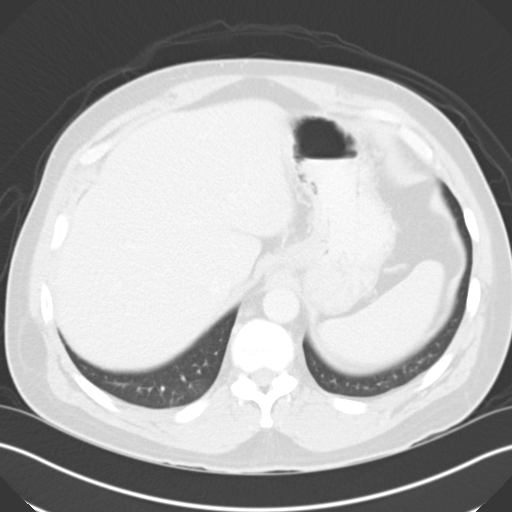
[im 86/96  lung]
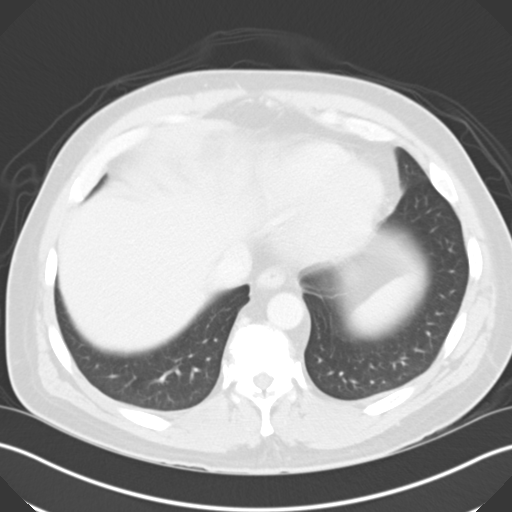
[im 91/96  soft-tissue]
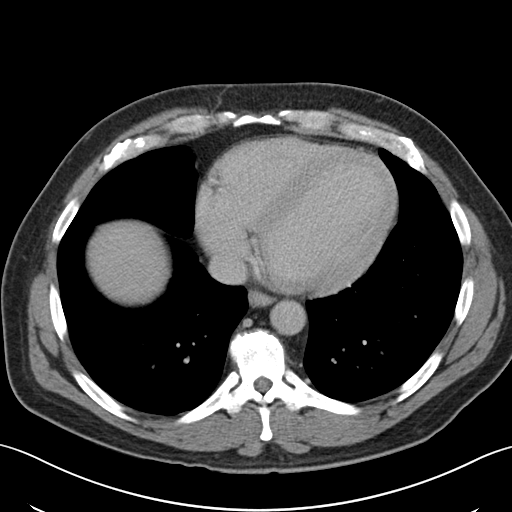
[im 91/96  lung]
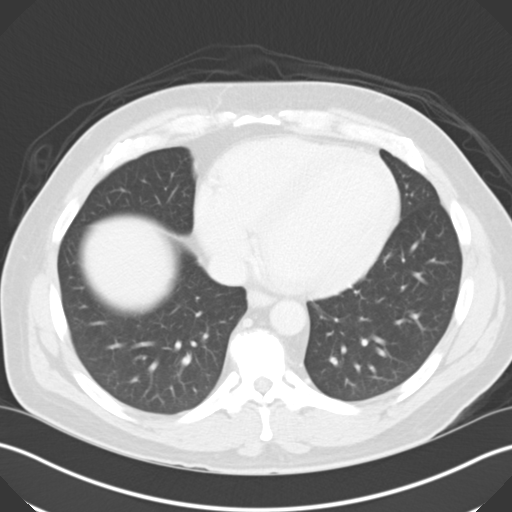

[14 of 32 positions shown; findings below may reference images not displayed]

FINDINGS: The lung bases are clear.  No pericardial or pleural effusion.

There is no focal liver abnormality identified.  The gallbladder
appears within normal limits.  No biliary dilatation.  The pancreas
appears within normal limits.  The spleen is within normal limits.

Both adrenal glands are normal.  The right kidney is normal.  The
left kidney is normal.  The urinary bladder is unremarkable.

No enlarged upper abdominal or pelvic lymph nodes.  No inguinal
adenopathy.

No free fluid or fluid collections identified within the abdomen or
the pelvis.  The stomach appears normal.  The small bowel loops are
upper limits of normal measuring up to 3.2 cm.  Enteric contrast
material is identified within the mid to distal small bowel loops.
The colon appears unremarkable.  There is a periumbilical hernia
which contains fat measuring 4.5 x 4.2 x 4.9 cm.  Fluid and fat
stranding is identified within this hernia which is suspicious for
incarceration.

Review of the visualized bony structures is significant for
advanced facet hypertrophy and degenerative change within the lower
lumbar spine.
IMPRESSION: 1.  Fat containing umbilical hernia is present which may be
incarcerated.
2.  Mild increased caliber of the proximal small bowel loops which
may indicate ileus.  No evidence for high-grade bowel obstruction.

## 2014-03-09 ENCOUNTER — Emergency Department (HOSPITAL_COMMUNITY)
Admission: EM | Admit: 2014-03-09 | Discharge: 2014-03-09 | Disposition: A | Discharge status: Home / Self Care | Payer: No Typology Code available for payment source | Attending: Emergency Medicine | Admitting: Emergency Medicine

## 2014-03-09 ENCOUNTER — Encounter (HOSPITAL_COMMUNITY): Payer: Self-pay | Admitting: Emergency Medicine

## 2014-03-09 DIAGNOSIS — F17200 Nicotine dependence, unspecified, uncomplicated: Secondary | ICD-10-CM | POA: Insufficient documentation

## 2014-03-09 DIAGNOSIS — J3489 Other specified disorders of nose and nasal sinuses: Secondary | ICD-10-CM | POA: Insufficient documentation

## 2014-03-09 DIAGNOSIS — B309 Viral conjunctivitis, unspecified: Secondary | ICD-10-CM

## 2014-03-09 DIAGNOSIS — Z79899 Other long term (current) drug therapy: Secondary | ICD-10-CM | POA: Insufficient documentation

## 2014-03-09 DIAGNOSIS — B308 Other viral conjunctivitis: Secondary | ICD-10-CM | POA: Insufficient documentation

## 2014-03-09 MED ORDER — ERYTHROMYCIN 5 MG/GM OP OINT: TOPICAL_OINTMENT | OPHTHALMIC | Status: AC

## 2014-03-09 MED ORDER — LORATADINE 10 MG PO TABS
10.0000 mg | ORAL_TABLET | Freq: Once | ORAL | Status: AC
  Administered 2014-03-09: 10 mg via ORAL
  Filled 2014-03-09: qty 1

## 2014-03-09 MED ORDER — LORATADINE 10 MG PO TABS: 10.0000 mg | ORAL_TABLET | Freq: Every day | ORAL | Status: AC

## 2014-03-09 MED ORDER — OLOPATADINE HCL 0.1 % OP SOLN
1.0000 [drp] | Freq: Two times a day (BID) | OPHTHALMIC | Status: DC
  Administered 2014-03-09: 1 [drp] via OPHTHALMIC
  Filled 2014-03-09: qty 5

## 2014-03-09 NOTE — ED Provider Notes (Signed)
CSN: [id number]     Arrival date & time 03/09/14  0809 History   First MD Initiated Contact with Patient 03/09/14 0830     Chief Complaint  Patient presents with  . eyes swollen      (Consider location/radiation/quality/duration/timing/severity/associated sxs/prior Treatment) HPI Gary Kaiser is a 48 y.o. male who presents to ED with complaint of red, swollen, itchy eyes onset 5 days ago. States drainage is white in the morning, clear through the day. No visual changes, no photophobia, no pain. States feels pressure over maxillary sinuses. Pt  Has been taking mucinex and aspirin with no relief. He has been applying cool compresses which helps briefly. Pt denies any eye injury, no exposure to direct light, no environmental exposures. No fever, chills. No sore throat. No cough. States he is having a mild congestion. No headache. No hx of the same.   History reviewed. No pertinent past medical history. Past Surgical History  Procedure Laterality Date  . Umbilical hernia repair  08/23/2012    Procedure: LAPAROSCOPIC UMBILICAL HERNIA;  Surgeon: Romie LeveeAlicia Thomas, MD;  Location: [facility];  Service: General;  Laterality: N/A;  . Hernia repair     Family History  Problem Relation Age of Onset  . Diabetes Mother    History  Substance Use Topics  . Smoking status: Current Every Day Smoker -- 0.50 packs/day for 20 years    Types: Cigarettes  . Smokeless tobacco: Never Used  . Alcohol Use: 3.6 oz/week    6 Cans of beer per week     Comment: states no alcohol x 1 week-trying to quit    Review of Systems  Constitutional: Negative for fever and chills.  HENT: Positive for congestion and sinus pressure. Negative for ear pain and sore throat.   Eyes: Positive for discharge, redness and itching. Negative for photophobia, pain and visual disturbance.  Respiratory: Negative for cough, chest tightness and shortness of breath.   Cardiovascular: Negative for chest pain, palpitations and leg swelling.   Genitourinary: Negative for dysuria, urgency, frequency and hematuria.  Musculoskeletal: Negative for arthralgias, myalgias, neck pain and neck stiffness.  Skin: Negative for rash.  Allergic/Immunologic: Negative for immunocompromised state.  Neurological: Negative for dizziness, weakness, light-headedness, numbness and headaches.      Allergies  Review of patient's allergies indicates no known allergies.  Home Medications   Current Outpatient Rx  Name  Route  Sig  Dispense  Refill  . diazepam (VALIUM) 5 MG tablet   Oral   Take 1 tablet (5 mg total) by mouth 2 (two) times daily.   10 tablet   0   . ibuprofen (ADVIL,MOTRIN) 200 MG tablet   Oral   Take 800 mg by mouth every 6 (six) hours as needed.         Marland Kitchen. oxyCODONE-acetaminophen (PERCOCET/ROXICET) 5-325 MG per tablet   Oral   Take 1-2 tablets by mouth every 6 (six) hours as needed for pain.   30 tablet   0   . oxyCODONE-acetaminophen (PERCOCET/ROXICET) 5-325 MG per tablet   Oral   Take 1-2 tablets by mouth every 6 (six) hours as needed for pain.   15 tablet   0    BP 152/85  Pulse 83  Temp(Src) 97.7 F (36.5 C) (Oral)  Resp 20  Ht 6\' 3"  (1.905 m)  Wt 242 lb (109.77 kg)  BMI 30.25 kg/m2  SpO2 95% Physical Exam  Nursing note and vitals reviewed. Constitutional: He is oriented to person, place, and time.  He appears well-developed and well-nourished. No distress.  HENT:  Head: Normocephalic and atraumatic.  Right Ear: External ear normal.  Left Ear: External ear normal.  Nose: Nose normal.  Mouth/Throat: Oropharynx is clear and moist.  No Sinus tenderness.  Eyes: EOM are normal. Pupils are equal, round, and reactive to light.  Slit lamp exam:      The right eye shows no hyphema and no hypopyon.       The left eye shows no hyphema and no hypopyon.  Conjunctiva injected bilaterally. Skin irritation over periorbital areas. No tenderness to palpation. Eyes watery, clear drainage.  Neck: Normal range of  motion. Neck supple.  Cardiovascular: Normal rate, regular rhythm and normal heart sounds.   Pulmonary/Chest: Effort normal and breath sounds normal. No respiratory distress. He has no wheezes. He has no rales.  Abdominal: Soft. There is no tenderness.  Lymphadenopathy:    He has no cervical adenopathy.  Neurological: He is alert and oriented to person, place, and time.  Skin: Skin is warm and dry.    ED Course  Procedures (including critical care time) Labs Review Labs Reviewed - No data to display Imaging Review No results found.   EKG Interpretation None      MDM   Final diagnoses:  Conjunctivitis, viral    Pt with bilatear eye redness, watering. No pain. No photophobia. No visual changes. No injury. Does not wear contacts. No periorbital swelling or tenderness. No pain with extraoccular eye movement. Will treat with claritine daily, patanol drops, erythromycin ointment. Most likely allergic vs viral vs bacterial conjunctivitis. No sigsn of corneal abrasion, no signs of orbital or periorbital cellulitis, no signs of uvuitis.   Visual Acuity - Bilateral Distance: 20/25 ; R Distance: 20/30 ; L Distance: 20/50   Filed Vitals:   03/09/14 0814 03/09/14 1036  BP: 152/85 155/87  Pulse: 83 70  Temp: 97.7 F (36.5 C)   TempSrc: Oral   Resp: 20 19  Height: 6\' 3"  (1.905 m)   Weight: 242 lb (109.77 kg)   SpO2: 95% 97%     Sheng Pritz A Chassie Pennix, PA-C 03/09/14 1600

## 2014-03-09 NOTE — Progress Notes (Signed)
[facility] provided pt with a list of primary care resources and a [facility] Orange Card application to help patient establish primary care.  °

## 2014-03-09 NOTE — Discharge Instructions (Signed)
Take claritin daily. Use patanol drops twice a day. Use erythromycin ointment every 4-6 hrs for at least 5 days. Follow up with eye specialist as referred. Return if worsening.    Viral Conjunctivitis Conjunctivitis is an irritation (inflammation) of the clear membrane that covers the white part of the eye (the conjunctiva). The irritation can also happen on the underside of the eyelids. Conjunctivitis makes the eye red or pink in color. This is what is commonly known as pink eye. Viral conjunctivitis can spread easily (contagious). CAUSES   Infection from virus on the surface of the eye.  Infection from the irritation or injury of nearby tissues such as the eyelids or cornea.  More serious inflammation or infection on the inside of the eye.  Other eye diseases.  The use of certain eye medications. SYMPTOMS  The normally white color of the eye or the underside of the eyelid is usually pink or red in color. The pink eye is usually associated with irritation, tearing and some sensitivity to light. Viral conjunctivitis is often associated with a clear, watery discharge. If a discharge is present, there may also be some blurred vision in the affected eye. DIAGNOSIS  Conjunctivitis is diagnosed by an eye exam. The eye specialist looks for changes in the surface tissues of the eye which take on changes characteristic of the specific types of conjunctivitis. A sample of any discharge may be collected on a Q-Tip (sterile swap). The sample will be sent to a lab to see whether or not the inflammation is caused by bacterial or viral infection. TREATMENT  Viral conjunctivitis will not respond to medicines that kill germs (antibiotics). Treatment is aimed at stopping a bacterial infection on top of the viral infection. The goal of treatment is to relieve symptoms (such as itching) with antihistamine drops or other eye medications.  HOME CARE INSTRUCTIONS   To ease discomfort, apply a cool, clean wash  cloth to your eye for 10 to 20 minutes, 3 to 4 times a day.  Gently wipe away any drainage from the eye with a warm, wet washcloth or a cotton ball.  Wash your hands often with soap and use paper towels to dry.  Do not share towels or washcloths. This may spread the infection.  Change or wash your pillowcase every day.  You should not use eye make-up until the infection is gone.  Stop using contacts lenses. Ask your eye professional how to sterilize or replace them before using again. This depends on the type of contact lenses used.  Do not touch the edge of the eyelid with the eye drop bottle or ointment tube when applying medications to the affected eye. This will stop you from spreading the infection to the other eye or to others. SEEK IMMEDIATE MEDICAL CARE IF:   The infection has not improved within 3 days of beginning treatment.  A watery discharge from the eye develops.  Pain in the eye increases.  The redness is spreading.  Vision becomes blurred.  An oral temperature above 102 F (38.9 C) develops, or as your caregiver suggests.  Facial pain, redness or swelling develops.  Any problems that may be related to the prescribed medicine develop. MAKE SURE YOU:   Understand these instructions.  Will watch your condition.  Will get help right away if you are not doing well or get worse. Document Released: 11/13/2005 Document Revised: 02/05/2012 Document Reviewed: 07/02/2008 [facility] Patient Information 2014 [place], [place].

## 2014-03-09 NOTE — ED Notes (Signed)
Patiaent c/o bilateral eye swelling, redness, and white drainage x 6 days.

## 2014-03-10 NOTE — ED Provider Notes (Signed)
Medical screening examination/treatment/procedure(s) were performed by non-physician practitioner and as supervising physician I was immediately available for consultation/collaboration.  Flint MelterElliott L Francyne Arreaga, MD 03/10/14 2000

## 2017-02-25 ENCOUNTER — Emergency Department (HOSPITAL_COMMUNITY)
Admission: EM | Admit: 2017-02-25 | Discharge: 2017-02-25 | Disposition: A | Discharge status: Home / Self Care | Payer: Self-pay | Attending: Emergency Medicine | Admitting: Emergency Medicine

## 2017-02-25 ENCOUNTER — Encounter (HOSPITAL_COMMUNITY): Payer: Self-pay

## 2017-02-25 DIAGNOSIS — M25519 Pain in unspecified shoulder: Secondary | ICD-10-CM

## 2017-02-25 DIAGNOSIS — M25512 Pain in left shoulder: Secondary | ICD-10-CM | POA: Insufficient documentation

## 2017-02-25 DIAGNOSIS — I10 Essential (primary) hypertension: Secondary | ICD-10-CM | POA: Insufficient documentation

## 2017-02-25 DIAGNOSIS — F17210 Nicotine dependence, cigarettes, uncomplicated: Secondary | ICD-10-CM | POA: Insufficient documentation

## 2017-02-25 HISTORY — DX: Unspecified dislocation of unspecified shoulder joint, initial encounter: S43.006A

## 2017-02-25 MED ORDER — METHOCARBAMOL 500 MG PO TABS: 500.0000 mg | ORAL_TABLET | Freq: Four times a day (QID) | ORAL | 0 refills | Status: DC

## 2017-02-25 NOTE — ED Provider Notes (Signed)
[facility]ER[facility]ENCY [facility]PT Provider Note   CSN: [id number] Arrival date & time: 02/25/17  0903     History   Chief Complaint Chief Complaint  Patient presents with  . Shoulder Pain    HPI Gary Kaiser is a 51 y.o. male.  51 year old male presents with left shoulder pain times several days. Pain is As a sharp and worse with movement. No associated dyspnea. No abdominal discomfort. Denies any recent trauma. No numbness or tingling to his left hand. No prior history of same. Has been using heat without relief.      Past Medical History:  Diagnosis Date  . Dislocated shoulder     There are no active problems to display for this patient.   Past Surgical History:  Procedure Laterality Date  . HERNIA REPAIR    . UMBILICAL HERNIA REPAIR  08/23/2012   Procedure: LAPAROSCOPIC UMBILICAL HERNIA;  Surgeon: Romie Levee, MD;  Location: [facility];  Service: General;  Laterality: N/A;       Home Medications    Prior to Admission medications   Medication Sig Start Date End Date Taking? Authorizing Provider  erythromycin ophthalmic ointment Place a 1/2 inch ribbon of ointment into the lower eyelid Q6 hrs 03/09/14   Tatyana Kirichenko, PA-C  ibuprofen (ADVIL,MOTRIN) 200 MG tablet Take 400 mg by mouth every 6 (six) hours as needed for moderate pain.     Historical Provider, MD  loratadine (CLARITIN) 10 MG tablet Take 1 tablet (10 mg total) by mouth daily. 03/09/14   Jaynie Crumble, PA-C    Family History Family History  Problem Relation Age of Onset  . Diabetes Mother     Social History Social History  Substance Use Topics  . Smoking status: Current Every Day Smoker    Packs/day: 0.50    Years: 20.00    Types: Cigarettes  . Smokeless tobacco: Never Used  . Alcohol use 3.6 oz/week    6 Cans of beer per week     Comment: occasionally     Allergies   Patient has no known allergies.   Review of Systems Review of Systems  All other systems reviewed and are  negative.    Physical Exam Updated Vital Signs BP (!) 176/100 (BP Location: Right Arm)   Pulse 61   Temp 97.7 F (36.5 C) (Oral)   Ht  (1.93 m)   Wt 113.4 kg   SpO2 96%   BMI 30.43 kg/m   Physical Exam  Constitutional: He is oriented to person, place, and time. He appears well-developed and well-nourished.  Non-toxic appearance. No distress.  HENT:  Head: Normocephalic and atraumatic.  Eyes: Conjunctivae, EOM and lids are normal. Pupils are equal, round, and reactive to light.  Neck: Normal range of motion. Neck supple. No tracheal deviation present. No thyroid mass present.  Cardiovascular: Normal rate, regular rhythm and normal heart sounds.  Exam reveals no gallop.   No murmur heard. Pulmonary/Chest: Effort normal and breath sounds normal. No stridor. No respiratory distress. He has no decreased breath sounds. He has no wheezes. He has no rhonchi. He has no rales.  Abdominal: Soft. Normal appearance and bowel sounds are normal. He exhibits no distension. There is no tenderness. There is no rebound and no CVA tenderness.  Musculoskeletal: Normal range of motion. He exhibits no edema or tenderness.       Arms: Neurological: He is alert and oriented to person, place, and time. He has normal strength. No cranial nerve deficit or sensory deficit.  GCS eye subscore is 4. GCS verbal subscore is 5. GCS motor subscore is 6.  Skin: Skin is warm and dry. No abrasion and no rash noted.  Psychiatric: He has a normal mood and affect. His speech is normal and behavior is normal.  Nursing note and vitals reviewed.    ED Treatments / Results  Labs (all labs ordered are listed, but only abnormal results are displayed) Labs Reviewed - No data to display  EKG  EKG Interpretation None       Radiology No results found.  Procedures Procedures (including critical care time)  Medications Ordered in ED Medications - No data to display   Initial Impression / Assessment and Plan /  ED Course  I have reviewed the triage vital signs and the nursing notes.  Pertinent labs & imaging results that were available during my care of the patient were reviewed by me and considered in my medical decision making (see chart for details).     Patient likely musculoskeletal strain. Will prescribe muscle relaxants and he also is mildly hypertensive and has been instructed to follow-up with his primary care doctor for this.  Final Clinical Impressions(s) / ED Diagnoses   Final diagnoses:  None    New Prescriptions New Prescriptions   No medications on file     Lorre Nick, MD 02/25/17 1018

## 2017-02-25 NOTE — ED Triage Notes (Signed)
Patient c/o left shoulder pain x 3 days. Patient deneis any injury.

## 2023-04-03 ENCOUNTER — Emergency Department (HOSPITAL_COMMUNITY)
Admission: EM | Admit: 2023-04-03 | Discharge: 2023-04-03 | Disposition: A | Discharge status: Home / Self Care | Payer: Self-pay | Attending: Emergency Medicine | Admitting: Emergency Medicine

## 2023-04-03 ENCOUNTER — Encounter (HOSPITAL_COMMUNITY): Payer: Self-pay

## 2023-04-03 ENCOUNTER — Other Ambulatory Visit: Payer: Self-pay

## 2023-04-03 DIAGNOSIS — L0390 Cellulitis, unspecified: Secondary | ICD-10-CM | POA: Insufficient documentation

## 2023-04-03 DIAGNOSIS — L309 Dermatitis, unspecified: Secondary | ICD-10-CM | POA: Insufficient documentation

## 2023-04-03 MED ORDER — TRIAMCINOLONE ACETONIDE 0.5 % EX OINT: 1.0000 | TOPICAL_OINTMENT | Freq: Two times a day (BID) | CUTANEOUS | 0 refills | Status: AC

## 2023-04-03 MED ORDER — CEPHALEXIN 500 MG PO CAPS: 500.0000 mg | ORAL_CAPSULE | Freq: Four times a day (QID) | ORAL | 0 refills | Status: DC

## 2023-04-03 MED ORDER — DIPHENHYDRAMINE HCL 25 MG PO CAPS
25.0000 mg | ORAL_CAPSULE | Freq: Once | ORAL | Status: AC
  Administered 2023-04-03: 25 mg via ORAL
  Filled 2023-04-03: qty 1

## 2023-04-03 MED ORDER — TRIAMCINOLONE ACETONIDE 0.5 % EX OINT
TOPICAL_OINTMENT | Freq: Two times a day (BID) | CUTANEOUS | Status: DC
  Filled 2023-04-03: qty 15

## 2023-04-03 NOTE — ED Triage Notes (Signed)
C/o generalized rash to bilateral arms, neck, ears, and feet x 1 day that itches.

## 2023-04-03 NOTE — Discharge Instructions (Signed)
We suspect that you likely are having eczema attack, with likely superimposed skin infection.  Apply the ointment that is prescribed to control the symptoms. In between the application of the medication, apply moisturizing ointment such as Aquaphor or Eucerin. Take the antibiotics that are prescribed.  Return to the ER if your symptoms get worse.

## 2023-04-03 NOTE — ED Notes (Signed)
Patient's bil feet wound cleaned and dressed.

## 2023-04-06 NOTE — ED Provider Notes (Signed)
[place] EMERGENCY DEPARTMENT AT [facility] Provider Note   CSN: [id number] Arrival date & time: 04/03/23  1806     History  Chief Complaint  Patient presents with   Rash    Gary Kaiser is a 57 y.o. male.  HPI    57 year old male comes in with chief complaint of rash.  Patient complains of rash to bilateral arms, neck, feet for the last 2 or 3 days.  Patient has noticed that the rash in the leg has worsened.  He is had a rash in the past, but the smaller lesions over the upper extremity.  He denies any outside exposure.  No new detergents, soap, clothing.  No bedbugs.  He has no known history of eczema.  Home Medications Prior to Admission medications   Medication Sig Start Date End Date Taking? Authorizing Provider  cephALEXin (KEFLEX) 500 MG capsule Take 1 capsule (500 mg total) by mouth 4 (four) times daily. 04/03/23  Yes Derwood Kaplan, MD  triamcinolone ointment (KENALOG) 0.5 % Apply 1 Application topically 2 (two) times daily. 04/03/23  Yes Derwood Kaplan, MD  erythromycin ophthalmic ointment Place a 1/2 inch ribbon of ointment into the lower eyelid Q6 hrs 03/09/14   Kirichenko, Tatyana, PA-C  ibuprofen (ADVIL,MOTRIN) 200 MG tablet Take 400 mg by mouth every 6 (six) hours as needed for moderate pain.     [provider]  loratadine (CLARITIN) 10 MG tablet Take 1 tablet (10 mg total) by mouth daily. 03/09/14   Kirichenko, Lemont Fillers, PA-C  methocarbamol (ROBAXIN) 500 MG tablet Take 1 tablet (500 mg total) by mouth 4 (four) times daily. 02/25/17   Lorre Nick, MD      Allergies    Patient has no known allergies.    Review of Systems   Review of Systems  All other systems reviewed and are negative.   Physical Exam Updated Vital Signs BP (!) 168/80   Pulse 76   Temp 98.5 F (36.9 C)   Resp 20   Wt 113 kg   SpO2 95%   BMI 30.32 kg/m  Physical Exam Vitals and nursing note reviewed.  Constitutional:      Appearance: He is well-developed.   HENT:     Head: Atraumatic.  Cardiovascular:     Rate and Rhythm: Normal rate.  Pulmonary:     Effort: Pulmonary effort is normal.  Musculoskeletal:     Cervical back: Neck supple.  Skin:    General: Skin is warm.     Findings: Erythema and rash present.  Neurological:     Mental Status: He is alert and oriented to person, place, and time.            ED Results / Procedures / Treatments   Labs (all labs ordered are listed, but only abnormal results are displayed) Labs Reviewed - No data to display  EKG None  Radiology No results found.  Procedures Procedures    Medications Ordered in ED Medications  diphenhydrAMINE (BENADRYL) capsule 25 mg (25 mg Oral Given 04/03/23 1946)    ED Course/ Medical Decision Making/ A&P                             Medical Decision Making Risk Prescription drug management.   57 year old male comes in with chief complaint of rash.  Rash has been present for 2 to 3 days, but has really worsened over the last 24 hours.  The  rash on the foot is worse.  He has no known skin disease history.  He has no known severe allergies and denies any exposures.  No new medications, no new antibiotics.  Differential diagnosis considered for him includes Stevens-Johnson's, TENS, eczema flareup, cellulitis, dermatitis.  Plan is to initiate triamcinolone and start him on Keflex.  It appears that" he might have superimposed infection.  Advised emergency room if symptoms are getting worse.  Unfortunately, patient states that he does not have a PCP.  Social determinants of health: Lack of primary care doctor, lack of access.  Final Clinical Impression(s) / ED Diagnoses Final diagnoses:  Eczema, unspecified type  Dermatitis  Cellulitis, unspecified cellulitis site    Rx / DC Orders ED Discharge Orders          Ordered    cephALEXin (KEFLEX) 500 MG capsule  4 times daily        04/03/23 1948    triamcinolone ointment (KENALOG) 0.5 %  2 times  daily        04/03/23 1948              Derwood Kaplan, MD 04/06/23 1357

## 2023-04-09 ENCOUNTER — Other Ambulatory Visit: Payer: Self-pay

## 2023-04-09 ENCOUNTER — Emergency Department (HOSPITAL_COMMUNITY)
Admission: EM | Admit: 2023-04-09 | Discharge: 2023-04-09 | Disposition: A | Discharge status: Home / Self Care | Payer: Self-pay | Attending: Emergency Medicine | Admitting: Emergency Medicine

## 2023-04-09 DIAGNOSIS — M79671 Pain in right foot: Secondary | ICD-10-CM | POA: Insufficient documentation

## 2023-04-09 DIAGNOSIS — M79672 Pain in left foot: Secondary | ICD-10-CM | POA: Insufficient documentation

## 2023-04-09 DIAGNOSIS — R21 Rash and other nonspecific skin eruption: Secondary | ICD-10-CM | POA: Insufficient documentation

## 2023-04-09 DIAGNOSIS — R030 Elevated blood-pressure reading, without diagnosis of hypertension: Secondary | ICD-10-CM | POA: Insufficient documentation

## 2023-04-09 MED ORDER — KETOROLAC TROMETHAMINE 15 MG/ML IJ SOLN
15.0000 mg | Freq: Once | INTRAMUSCULAR | Status: AC
  Administered 2023-04-09: 15 mg via INTRAMUSCULAR
  Filled 2023-04-09: qty 1

## 2023-04-09 MED ORDER — CEPHALEXIN 500 MG PO CAPS: 500.0000 mg | ORAL_CAPSULE | Freq: Four times a day (QID) | ORAL | 0 refills | Status: AC

## 2023-04-09 NOTE — Discharge Instructions (Signed)
Please use Tylenol or ibuprofen for pain.  You may use 600 mg ibuprofen every 6 hours or 1000 mg of Tylenol every 6 hours.  You may choose to alternate between the 2.  This would be most effective.  Not to exceed 4 g of Tylenol within 24 hours.  Not to exceed 3200 mg ibuprofen 24 hours.  Please finish the entire course of antibiotics, and follow-up with the primary care doctor, you will need to establish a primary care doctor., you can call the number above for help doing so.

## 2023-04-09 NOTE — ED Provider Notes (Signed)
[place] EMERGENCY DEPARTMENT AT [facility] Provider Note   CSN: [id number] Arrival date & time: 04/09/23  1026     History  Chief Complaint  Patient presents with   Foot Pain    Gary Kaiser is a 57 y.o. male with noncontributory past medical history who presents with concern for bilateral foot rash with pain.  Patient was seen last week for same.  Patient reports that it started suddenly.  He has no previous history of eczema, but did have some eczematous skin changes at his last emergency room visit.  He reports that he is taking care course of Keflex as prescribed.   Foot Pain       Home Medications Prior to Admission medications   Medication Sig Start Date End Date Taking? Authorizing Provider  cephALEXin (KEFLEX) 500 MG capsule Take 1 capsule (500 mg total) by mouth 4 (four) times daily. 04/09/23  Yes Wilena Tyndall H, PA-C  erythromycin ophthalmic ointment Place a 1/2 inch ribbon of ointment into the lower eyelid Q6 hrs 03/09/14   Kirichenko, Tatyana, PA-C  ibuprofen (ADVIL,MOTRIN) 200 MG tablet Take 400 mg by mouth every 6 (six) hours as needed for moderate pain.     [provider]  loratadine (CLARITIN) 10 MG tablet Take 1 tablet (10 mg total) by mouth daily. 03/09/14   Kirichenko, Lemont Fillers, PA-C  methocarbamol (ROBAXIN) 500 MG tablet Take 1 tablet (500 mg total) by mouth 4 (four) times daily. 02/25/17   Lorre Nick, MD  triamcinolone ointment (KENALOG) 0.5 % Apply 1 Application topically 2 (two) times daily. 04/03/23   Derwood Kaplan, MD      Allergies    Patient has no known allergies.    Review of Systems   Review of Systems  Skin:  Positive for rash.  All other systems reviewed and are negative.   Physical Exam Updated Vital Signs BP (!) 172/82 (BP Location: Left Arm)   Pulse 85   Temp 97.7 F (36.5 C) (Oral)   Resp 16   Ht 6\' 3"  (1.905 m)   Wt 113 kg   SpO2 99%   BMI 31.14 kg/m  Physical Exam Vitals and nursing note  reviewed.  Constitutional:      General: He is not in acute distress.    Appearance: Normal appearance.  HENT:     Head: Normocephalic and atraumatic.  Eyes:     General:        Right eye: No discharge.        Left eye: No discharge.  Cardiovascular:     Rate and Rhythm: Normal rate and regular rhythm.  Pulmonary:     Effort: Pulmonary effort is normal. No respiratory distress.  Musculoskeletal:        General: No deformity.  Skin:    General: Skin is warm and dry.     Comments: Patient with excoriating skin rash on bilateral arms, bilateral feet, no purulent drainage noted. Appropriately healing granulation tissue noted  Neurological:     Mental Status: He is alert and oriented to person, place, and time.  Psychiatric:        Mood and Affect: Mood normal.        Behavior: Behavior normal.     ED Results / Procedures / Treatments   Labs (all labs ordered are listed, but only abnormal results are displayed) Labs Reviewed - No data to display  EKG None  Radiology No results found.  Procedures Procedures    Medications  Ordered in ED Medications  ketorolac (TORADOL) 15 MG/ML injection 15 mg (15 mg Intramuscular Given 04/09/23 1124)    ED Course/ Medical Decision Making/ A&P Clinical Course as of 04/09/23 1211  Mon Apr 09, 2023  1102 Stable  57 YOM with rash  On arms/BL feet with pustules  [CC]  1110 Evaluated personally bedside.  Appears to have a cellulitis that continues to improve. Based on improvement with streptococcal coverage, will continue for an additional 5 days with Keflex.  Will recommend follow-up with primary care provider in the outpatient setting. [CC]    Clinical Course User Index [CC] Glyn Ade, MD                             Medical Decision Making  This patient is a 57 y.o. male who presents to the ED for concern of foot rash.   Differential diagnoses prior to evaluation: ongoing cellulitis, eczema versus other  Past Medical  History / Social History / Additional history: Chart reviewed. Pertinent results include: overall noncontributory  Physical Exam: Physical exam performed. The pertinent findings include: Patient with excoriating skin rash on bilateral arms, bilateral feet, no purulent drainage noted. Appropriately healing granulation tissue noted  Medications / Treatment: Will extend patient's course of Keflex as it does seem to be responding to the treatment that he is already on, encourage close PCP follow-up, he will need to establish care   Disposition: After consideration of the diagnostic results and the patients response to treatment, I feel that patient is stable for discharge and time with plan as above.   emergency department workup does not suggest an emergent condition requiring admission or immediate intervention beyond what has been performed at this time. The plan is: as above. The patient is safe for discharge and has been instructed to return immediately for worsening symptoms, change in symptoms or any other concerns.  Final Clinical Impression(s) / ED Diagnoses Final diagnoses:  Rash of both feet  Bilateral foot pain  Elevated blood pressure reading    Rx / DC Orders ED Discharge Orders          Ordered    cephALEXin (KEFLEX) 500 MG capsule  4 times daily        04/09/23 1111              Kina Shiffman, Harrel Carina, PA-C 04/09/23 1211    Glyn Ade, MD 04/10/23 [phone]

## 2023-04-09 NOTE — ED Triage Notes (Signed)
Pt reports bilateral feet pain from rash states has been seen for same in the past week.

## 2023-06-09 ENCOUNTER — Emergency Department (HOSPITAL_COMMUNITY)
Admission: EM | Admit: 2023-06-09 | Discharge: 2023-06-10 | Disposition: A | Discharge status: Home / Self Care | Payer: Self-pay | Attending: Emergency Medicine | Admitting: Emergency Medicine

## 2023-06-09 ENCOUNTER — Encounter (HOSPITAL_COMMUNITY): Payer: Self-pay

## 2023-06-09 ENCOUNTER — Other Ambulatory Visit: Payer: Self-pay

## 2023-06-09 DIAGNOSIS — M5431 Sciatica, right side: Secondary | ICD-10-CM | POA: Insufficient documentation

## 2023-06-09 NOTE — ED Triage Notes (Signed)
Pt. Arrives POV c/o R. Hip pain that radiates down to the foot. Pt. States that when he walks on it, it feels like is foot is falling asleep. Pt. States that someone gave him a 800mg  ibuprofen pill that was not helpful. He also states that he took one tylenol pill and that also did not help.

## 2023-06-10 MED ORDER — METHOCARBAMOL 500 MG PO TABS: 500.0000 mg | ORAL_TABLET | Freq: Two times a day (BID) | ORAL | 0 refills | Status: AC

## 2023-06-10 MED ORDER — DEXAMETHASONE 4 MG PO TABS
6.0000 mg | ORAL_TABLET | Freq: Once | ORAL | Status: AC
  Administered 2023-06-10: 6 mg via ORAL
  Filled 2023-06-10: qty 1

## 2023-06-10 MED ORDER — KETOROLAC TROMETHAMINE 60 MG/2ML IM SOLN
30.0000 mg | Freq: Once | INTRAMUSCULAR | Status: AC
  Administered 2023-06-10: 30 mg via INTRAMUSCULAR
  Filled 2023-06-10: qty 2

## 2023-06-10 NOTE — Discharge Instructions (Addendum)
You may use over-the-counter 600 mg every 6-8 hours of Motrin (Ibuprofen), 1000 mg every 8 hours of Acetaminophen (Tylenol), topical muscle creams such as SalonPas, [employer], Bengay, etc. Please stretch, apply ice or heat (whichever helps), and have massage therapy for additional assistance.

## 2023-06-10 NOTE — ED Provider Notes (Signed)
[place] EMERGENCY DEPARTMENT AT [facility] Provider Note  CSN: [id number] Arrival date & time: 06/09/23 2221  Chief Complaint(s) Hip Pain  HPI Gary Kaiser is a 57 y.o. male here for right gluteal pain that began earlier today radiating down to his foot.  Endorses aching pain worse with walking.  He denies any fall or trauma.  No bladder/bowel incontinence.  Patient reports that he has had similar symptoms in the past before that were less severe.  He denies any lower back pain, abdominal pain or no other physical complaints.   The history is provided by the patient.    Past Medical History Past Medical History:  Diagnosis Date   Dislocated shoulder    There are no problems to display for this patient.  Home Medication(s) Prior to Admission medications   Medication Sig Start Date End Date Taking? Authorizing Provider  methocarbamol (ROBAXIN) 500 MG tablet Take 1 tablet (500 mg total) by mouth 2 (two) times daily. 06/10/23  Yes Vayden Weinand, Amadeo Garnet, MD  cephALEXin (KEFLEX) 500 MG capsule Take 1 capsule (500 mg total) by mouth 4 (four) times daily. 04/09/23   Prosperi, Christian H, PA-C  erythromycin ophthalmic ointment Place a 1/2 inch ribbon of ointment into the lower eyelid Q6 hrs 03/09/14   Kirichenko, Tatyana, PA-C  ibuprofen (ADVIL,MOTRIN) 200 MG tablet Take 400 mg by mouth every 6 (six) hours as needed for moderate pain.     [provider]  loratadine (CLARITIN) 10 MG tablet Take 1 tablet (10 mg total) by mouth daily. 03/09/14   Kirichenko, Lemont Fillers, PA-C  triamcinolone ointment (KENALOG) 0.5 % Apply 1 Application topically 2 (two) times daily. 04/03/23   Derwood Kaplan, MD                                                                                                                                    Allergies Patient has no known allergies.  Review of Systems Review of Systems As noted in HPI  Physical Exam Vital Signs  I have reviewed the triage  vital signs BP (!) 184/107   Pulse 97   Temp 98.7 F (37.1 C) (Oral)   Resp 15   Ht 6' (1.829 m)   Wt 113.4 kg   SpO2 93%   BMI 33.91 kg/m   Physical Exam Vitals reviewed.  Constitutional:      General: He is not in acute distress.    Appearance: He is well-developed. He is not diaphoretic.  HENT:     Head: Normocephalic and atraumatic.     Right Ear: External ear normal.     Left Ear: External ear normal.     Nose: Nose normal.     Mouth/Throat:     Mouth: Mucous membranes are moist.  Eyes:     General: No scleral icterus.    Conjunctiva/sclera: Conjunctivae normal.  Neck:     Trachea: Phonation normal.  Cardiovascular:     Rate and Rhythm: Normal rate and regular rhythm.  Pulmonary:     Effort: Pulmonary effort is normal. No respiratory distress.     Breath sounds: No stridor.  Abdominal:     General: There is no distension.  Musculoskeletal:        General: Normal range of motion.     Cervical back: Normal range of motion.     Thoracic back: No tenderness or bony tenderness.     Lumbar back: No tenderness or bony tenderness.       Back:  Neurological:     Mental Status: He is alert and oriented to person, place, and time.     Comments: Spine Exam: Strength: 5/5 throughout LE bilaterally (hip flexion/extension, adduction/abduction; knee flexion/extension; foot dorsiflexion/plantarflexion, inversion/eversion; great toe inversion) Sensation: Intact to light touch in proximal and distal LE bilaterally Reflexes: no clonus   Psychiatric:        Behavior: Behavior normal.     ED Results and Treatments Labs (all labs ordered are listed, but only abnormal results are displayed) Labs Reviewed - No data to display                                                                                                                       EKG  EKG Interpretation Date/Time:    Ventricular Rate:    PR Interval:    QRS Duration:    QT Interval:    QTC Calculation:   R  Axis:      Text Interpretation:         Radiology No results found.  Medications Ordered in ED Medications  ketorolac (TORADOL) injection 30 mg (has no administration in time range)  dexamethasone (DECADRON) tablet 6 mg (has no administration in time range)   Procedures Procedures  (including critical care time) Medical Decision Making / ED Course   Medical Decision Making   57 y.o. male presents with right gluteal pain with radiation to foot. No acute traumatic onset. No red flag symptoms of fever, weight loss, saddle anesthesia, weakness, fecal/urinary incontinence or urinary retention.   Suspect MSK etiology. No indication for imaging emergently.   Provided with IM toradol and oral decadron.  Patient was recommended to take short course of scheduled NSAIDs and engage in early mobility as definitive treatment. Return precautions discussed for worsening or new concerning symptoms.      Final Clinical Impression(s) / ED Diagnoses Final diagnoses:  Sciatica of right side   The patient appears reasonably screened and/or stabilized for discharge and I doubt any other medical condition or other [facility] requiring further screening, evaluation, or treatment in the ED at this time. I have discussed the findings, Dx and Tx plan with the patient/family who expressed understanding and agree(s) with the plan. Discharge instructions discussed at length. The patient/family was given strict return precautions who verbalized understanding of the instructions. No further questions at time of discharge.  Disposition: Discharge  Condition: Good  ED Discharge Orders          Ordered    methocarbamol (ROBAXIN) 500 MG tablet  2 times daily        06/10/23 0036             Follow Up: Primary care provider  Call  if you do not have a primary care physician, contact [facility] at [phone] for referral     This chart was dictated using voice recognition software.  Despite  best efforts to proofread,  errors can occur which can change the documentation meaning.    Nira Conn, MD 06/10/23 0040

## 2024-01-07 ENCOUNTER — Emergency Department (HOSPITAL_COMMUNITY)
Admission: EM | Admit: 2024-01-07 | Discharge: 2024-01-08 | Disposition: A | Discharge status: Home / Self Care | Payer: Medicaid Other | Attending: Emergency Medicine | Admitting: Emergency Medicine

## 2024-01-07 ENCOUNTER — Encounter (HOSPITAL_COMMUNITY): Payer: Self-pay

## 2024-01-07 ENCOUNTER — Other Ambulatory Visit: Payer: Self-pay

## 2024-01-07 DIAGNOSIS — M25461 Effusion, right knee: Secondary | ICD-10-CM | POA: Insufficient documentation

## 2024-01-07 DIAGNOSIS — M25462 Effusion, left knee: Secondary | ICD-10-CM | POA: Insufficient documentation

## 2024-01-07 DIAGNOSIS — R2243 Localized swelling, mass and lump, lower limb, bilateral: Secondary | ICD-10-CM | POA: Diagnosis present

## 2024-01-07 LAB — COMPREHENSIVE METABOLIC PANEL
ALT: 12 U/L (ref 0–44)
AST: 17 U/L (ref 15–41)
Albumin: 4 g/dL (ref 3.5–5.0)
Alkaline Phosphatase: 63 U/L (ref 38–126)
Anion gap: 10 (ref 5–15)
BUN: 18 mg/dL (ref 6–20)
CO2: 24 mmol/L (ref 22–32)
Calcium: 9.1 mg/dL (ref 8.9–10.3)
Chloride: 101 mmol/L (ref 98–111)
Creatinine, Ser: 0.67 mg/dL (ref 0.61–1.24)
GFR, Estimated: >60 mL/min (ref >60)
Glucose, Bld: 90 mg/dL (ref 70–99)
Potassium: 3.7 mmol/L (ref 3.5–5.1)
Sodium: 135 mmol/L (ref 135–145)
Total Bilirubin: 1.1 mg/dL (ref 0.0–1.2)
Total Protein: 7.5 g/dL (ref 6.5–8.1)

## 2024-01-07 LAB — CBC WITH DIFFERENTIAL/PLATELET
Abs Immature Granulocytes: 0.02 10*3/uL (ref 0.00–0.07)
Basophils Absolute: 0.1 10*3/uL (ref 0.0–0.1)
Basophils Relative: 1 %
Eosinophils Absolute: 0.2 10*3/uL (ref 0.0–0.5)
Eosinophils Relative: 2 %
HCT: 43.8 % (ref 39.0–52.0)
Hemoglobin: 14.5 g/dL (ref 13.0–17.0)
Immature Granulocytes: 0 %
Lymphocytes Relative: 24 %
Lymphs Abs: 2 10*3/uL (ref 0.7–4.0)
MCH: 30.7 pg (ref 26.0–34.0)
MCHC: 33.1 g/dL (ref 30.0–36.0)
MCV: 92.6 fL (ref 80.0–100.0)
Monocytes Absolute: 0.6 10*3/uL (ref 0.1–1.0)
Monocytes Relative: 7 %
Neutro Abs: 5.5 10*3/uL (ref 1.7–7.7)
Neutrophils Relative %: 66 %
Platelets: 293 10*3/uL (ref 150–400)
RBC: 4.73 MIL/uL (ref 4.22–5.81)
RDW: 12.9 % (ref 11.5–15.5)
WBC: 8.3 10*3/uL (ref 4.0–10.5)
nRBC: 0 % (ref 0.0–0.2)

## 2024-01-07 NOTE — ED Triage Notes (Signed)
 Bilateral knee pain and swelling that started yesterday that is worse when walking. Intermittent sharp pains in both calves

## 2024-01-08 MED ORDER — PREDNISONE 10 MG PO TABS: 40.0000 mg | ORAL_TABLET | Freq: Every day | ORAL | 0 refills | Status: AC

## 2024-01-08 MED ORDER — FAMOTIDINE 20 MG PO TABS: 20.0000 mg | ORAL_TABLET | Freq: Two times a day (BID) | ORAL | 0 refills | Status: AC

## 2024-01-08 MED ORDER — PREDNISONE 20 MG PO TABS
40.0000 mg | ORAL_TABLET | Freq: Once | ORAL | Status: AC
  Administered 2024-01-08: 40 mg via ORAL
  Filled 2024-01-08: qty 2

## 2024-01-08 MED ORDER — IBUPROFEN 600 MG PO TABS: 600.0000 mg | ORAL_TABLET | Freq: Four times a day (QID) | ORAL | 0 refills | Status: AC | PRN

## 2024-01-08 MED ORDER — IBUPROFEN 200 MG PO TABS
600.0000 mg | ORAL_TABLET | Freq: Once | ORAL | Status: AC
Start: 2024-01-08 — End: 2024-01-08
  Administered 2024-01-08: 600 mg via ORAL
  Filled 2024-01-08: qty 3

## 2024-01-08 NOTE — ED Provider Notes (Signed)
[place] EMERGENCY DEPARTMENT AT [facility] Provider Note   CSN: [id number] Arrival date & time: 01/07/24  1819     History  Chief Complaint  Patient presents with   Joint Swelling    TORRIS HOUSE is a 58 y.o. male.  The history is provided by the patient.   BESNIK FEBUS is a 58 y.o. male who presents to the Emergency Department complaining of knee swelling.  He presents to the emergency department for swelling to bilateral knees with associated discomfort to bilateral knees.  At times it radiates to bilateral calves.  No associated fever, chest pain, difficulty breathing.  He has no known medical problems and takes no routine medications.  He does not currently have a PCP.  He did have a similar episode in the past with swelling in 1 knee.  He states that he had fluid drawn off but does not know what the diagnosis was.    Home Medications Prior to Admission medications   Medication Sig Start Date End Date Taking? Authorizing Provider  famotidine (PEPCID) 20 MG tablet Take 1 tablet (20 mg total) by mouth 2 (two) times daily. 01/08/24  Yes Tilden Fossa, MD  ibuprofen (ADVIL) 600 MG tablet Take 1 tablet (600 mg total) by mouth every 6 (six) hours as needed. 01/08/24  Yes Tilden Fossa, MD  predniSONE (DELTASONE) 10 MG tablet Take 4 tablets (40 mg total) by mouth daily. 01/08/24  Yes Tilden Fossa, MD  cephALEXin (KEFLEX) 500 MG capsule Take 1 capsule (500 mg total) by mouth 4 (four) times daily. 04/09/23   Prosperi, Christian H, PA-C  erythromycin ophthalmic ointment Place a 1/2 inch ribbon of ointment into the lower eyelid Q6 hrs 03/09/14   Kirichenko, Tatyana, PA-C  loratadine (CLARITIN) 10 MG tablet Take 1 tablet (10 mg total) by mouth daily. 03/09/14   Kirichenko, Lemont Fillers, PA-C  methocarbamol (ROBAXIN) 500 MG tablet Take 1 tablet (500 mg total) by mouth 2 (two) times daily. 06/10/23   Nira Conn, MD  triamcinolone ointment (KENALOG) 0.5 % Apply 1  Application topically 2 (two) times daily. 04/03/23   Derwood Kaplan, MD      Allergies    Patient has no known allergies.    Review of Systems   Review of Systems  All other systems reviewed and are negative.   Physical Exam Updated Vital Signs BP (!) 161/101   Pulse (!) 56   Temp 98.2 F (36.8 C) (Oral)   Resp 18   Ht 6\' 3"  (1.905 m)   Wt 88 kg   SpO2 97%   BMI 24.26 kg/m  Physical Exam Vitals and nursing note reviewed.  Constitutional:      Appearance: He is well-developed.  HENT:     Head: Normocephalic and atraumatic.  Cardiovascular:     Rate and Rhythm: Normal rate and regular rhythm.  Pulmonary:     Effort: Pulmonary effort is normal. No respiratory distress.  Musculoskeletal:     Comments: 2+ DP pulses bilaterally.  He has bilateral knee effusions, left slightly greater than right.  He is able to flex and extend the knees without significant tenderness to the knees.  No distal lower extremity edema.  Skin:    General: Skin is warm and dry.  Neurological:     Mental Status: He is alert and oriented to person, place, and time.  Psychiatric:        Behavior: Behavior normal.     ED Results / Procedures /  Treatments   Labs (all labs ordered are listed, but only abnormal results are displayed) Labs Reviewed  COMPREHENSIVE METABOLIC PANEL  CBC WITH DIFFERENTIAL/PLATELET    EKG None  Radiology No results found.  Procedures Procedures    Medications Ordered in ED Medications  predniSONE (DELTASONE) tablet 40 mg (has no administration in time range)  ibuprofen (ADVIL) tablet 600 mg (has no administration in time range)    ED Course/ Medical Decision Making/ A&P                                 Medical Decision Making Amount and/or Complexity of Data Reviewed Labs: ordered.  Risk OTC drugs. Prescription drug management.   Patient here for evaluation of bilateral knee swelling.  He does have effusions on examination, left greater than  right.  No clinical evidence of DVT, limb ischemia, cellulitis.  Current clinical picture is not consistent with septic or gouty arthritis.  Will treat with steroids for likely inflammatory arthritis.  He has knee sleeves already, recommend that he continue using these.  Will provide resources for PCP establishment as he does not have a current PCP.  Will prescribe Pepcid for gastric protection while on steroid, NSAID course.  Discussed outpatient follow-up and return precautions.        Final Clinical Impression(s) / ED Diagnoses Final diagnoses:  Effusion of both knee joints    Rx / DC Orders ED Discharge Orders          Ordered    predniSONE (DELTASONE) 10 MG tablet  Daily        01/08/24 0242    famotidine (PEPCID) 20 MG tablet  2 times daily        01/08/24 0242    ibuprofen (ADVIL) 600 MG tablet  Every 6 hours PRN        01/08/24 0242              Tilden Fossa, MD 01/08/24 [phone]
